# Patient Record
Sex: Female | Born: 1975 | Race: White | Hispanic: No | Marital: Married | Smoking: Never smoker
Health system: Southern US, Community
[De-identification: ages and names within clinical notes are randomized; demographics above are authoritative.]

## PROBLEM LIST (undated history)

## (undated) DIAGNOSIS — E079 Disorder of thyroid, unspecified: Secondary | ICD-10-CM

## (undated) DIAGNOSIS — K760 Fatty (change of) liver, not elsewhere classified: Secondary | ICD-10-CM

## (undated) DIAGNOSIS — K602 Anal fissure, unspecified: Secondary | ICD-10-CM

## (undated) DIAGNOSIS — I10 Essential (primary) hypertension: Secondary | ICD-10-CM

## (undated) DIAGNOSIS — E785 Hyperlipidemia, unspecified: Secondary | ICD-10-CM

## (undated) HISTORY — DX: Essential (primary) hypertension: I10

## (undated) HISTORY — DX: Fatty (change of) liver, not elsewhere classified: K76.0

## (undated) HISTORY — DX: Hyperlipidemia, unspecified: E78.5

## (undated) HISTORY — DX: Disorder of thyroid, unspecified: E07.9

## (undated) HISTORY — DX: Anal fissure, unspecified: K60.2

---

## 2000-09-11 ENCOUNTER — Other Ambulatory Visit: Admission: RE | Admit: 2000-09-11 | Discharge: 2000-09-11 | Payer: Self-pay | Admitting: Obstetrics & Gynecology

## 2001-04-10 ENCOUNTER — Inpatient Hospital Stay (HOSPITAL_COMMUNITY): Admission: AD | Admit: 2001-04-10 | Discharge: 2001-04-10 | Payer: Self-pay | Admitting: Obstetrics and Gynecology

## 2001-04-11 ENCOUNTER — Inpatient Hospital Stay (HOSPITAL_COMMUNITY): Admission: AD | Admit: 2001-04-11 | Discharge: 2001-04-13 | Payer: Self-pay | Admitting: Obstetrics and Gynecology

## 2001-04-17 ENCOUNTER — Encounter: Admission: RE | Admit: 2001-04-17 | Discharge: 2001-05-17 | Payer: Self-pay | Admitting: Obstetrics and Gynecology

## 2001-05-13 ENCOUNTER — Other Ambulatory Visit: Admission: RE | Admit: 2001-05-13 | Discharge: 2001-05-13 | Payer: Self-pay | Admitting: Obstetrics & Gynecology

## 2001-05-18 ENCOUNTER — Encounter: Admission: RE | Admit: 2001-05-18 | Discharge: 2001-06-17 | Payer: Self-pay | Admitting: Obstetrics and Gynecology

## 2002-05-21 ENCOUNTER — Other Ambulatory Visit: Admission: RE | Admit: 2002-05-21 | Discharge: 2002-05-21 | Payer: Self-pay | Admitting: Obstetrics & Gynecology

## 2003-08-20 ENCOUNTER — Other Ambulatory Visit: Admission: RE | Admit: 2003-08-20 | Discharge: 2003-08-20 | Payer: Self-pay | Admitting: Obstetrics & Gynecology

## 2004-08-24 ENCOUNTER — Other Ambulatory Visit: Admission: RE | Admit: 2004-08-24 | Discharge: 2004-08-24 | Payer: Self-pay | Admitting: Obstetrics & Gynecology

## 2004-10-08 HISTORY — PX: DILATION AND CURETTAGE OF UTERUS: SHX78

## 2004-11-16 ENCOUNTER — Ambulatory Visit (HOSPITAL_COMMUNITY): Admission: RE | Admit: 2004-11-16 | Discharge: 2004-11-16 | Payer: Self-pay | Admitting: Obstetrics & Gynecology

## 2005-09-10 ENCOUNTER — Other Ambulatory Visit: Admission: RE | Admit: 2005-09-10 | Discharge: 2005-09-10 | Payer: Self-pay | Admitting: Obstetrics & Gynecology

## 2006-01-24 ENCOUNTER — Inpatient Hospital Stay (HOSPITAL_COMMUNITY): Admission: AD | Admit: 2006-01-24 | Discharge: 2006-01-24 | Payer: Self-pay | Admitting: Obstetrics and Gynecology

## 2006-01-25 ENCOUNTER — Inpatient Hospital Stay (HOSPITAL_COMMUNITY): Admission: AD | Admit: 2006-01-25 | Discharge: 2006-02-01 | Payer: Self-pay | Admitting: Obstetrics & Gynecology

## 2006-02-02 ENCOUNTER — Encounter: Admission: RE | Admit: 2006-02-02 | Discharge: 2006-03-03 | Payer: Self-pay | Admitting: Obstetrics & Gynecology

## 2006-03-04 ENCOUNTER — Encounter: Admission: RE | Admit: 2006-03-04 | Discharge: 2006-03-13 | Payer: Self-pay | Admitting: Obstetrics & Gynecology

## 2007-05-20 IMAGING — US US FETAL BPP W/O NONSTRESS
1 series · 14 of 23 positions shown · non-contrast
Comparison: none

CLINICAL DATA: Pregnancy induced hypertension.  30 week 5 day assigned gestational age.  Evaluate BPP.

[Series 1: us fetal bpp w/o nonstress · 0.35mm/px · 14 of 23 slices shown]
[im 1/23]
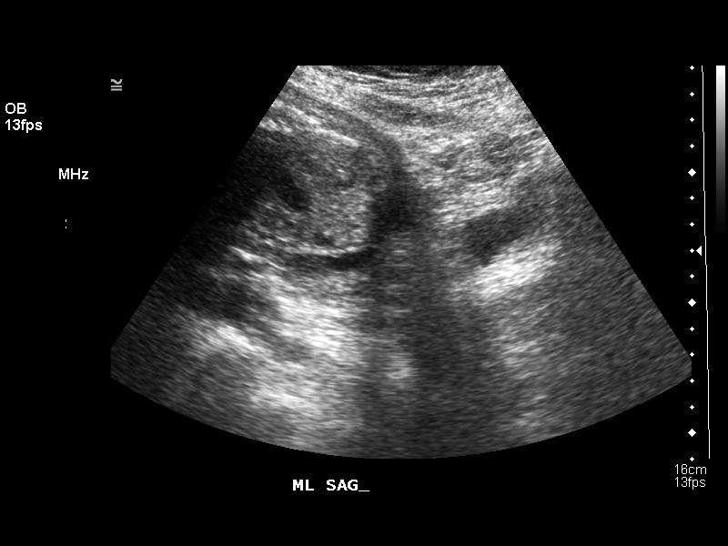
[im 3/23]
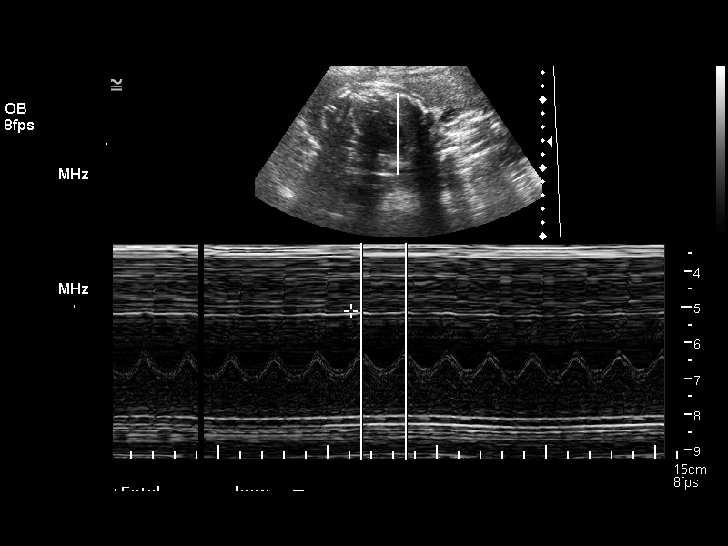
[im 5/23]
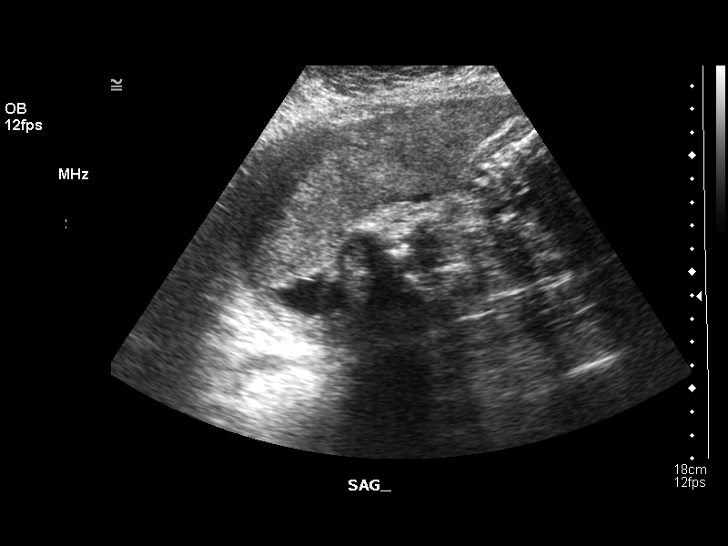
[im 6/23]
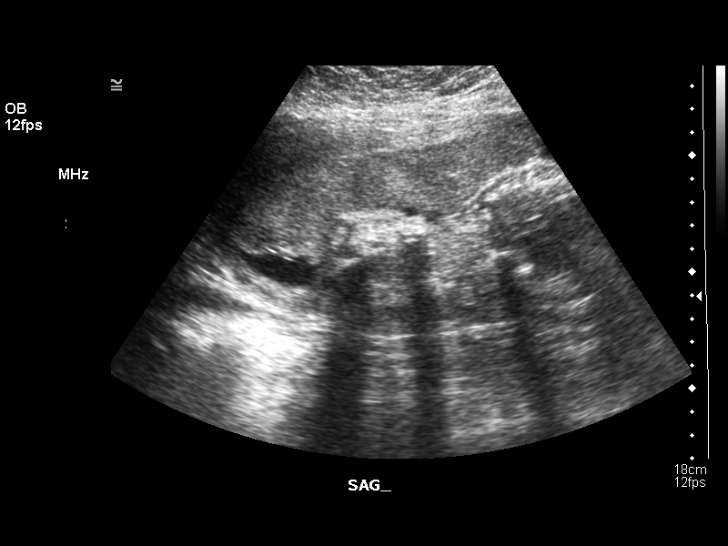
[im 8/23]
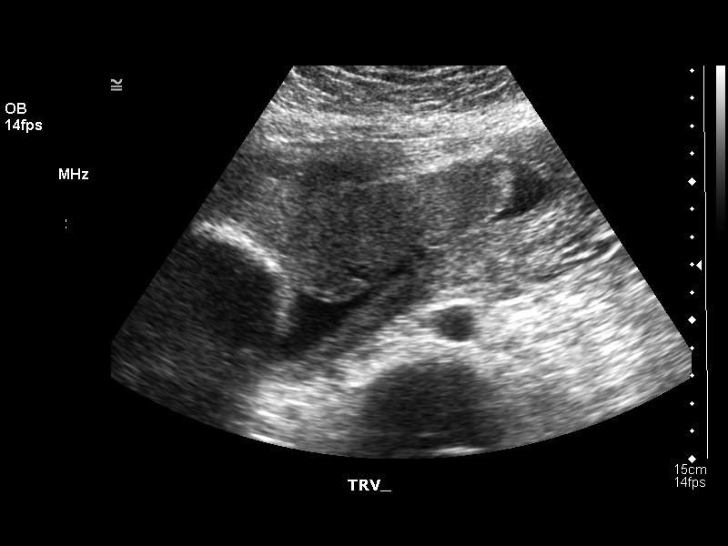
[im 10/23]
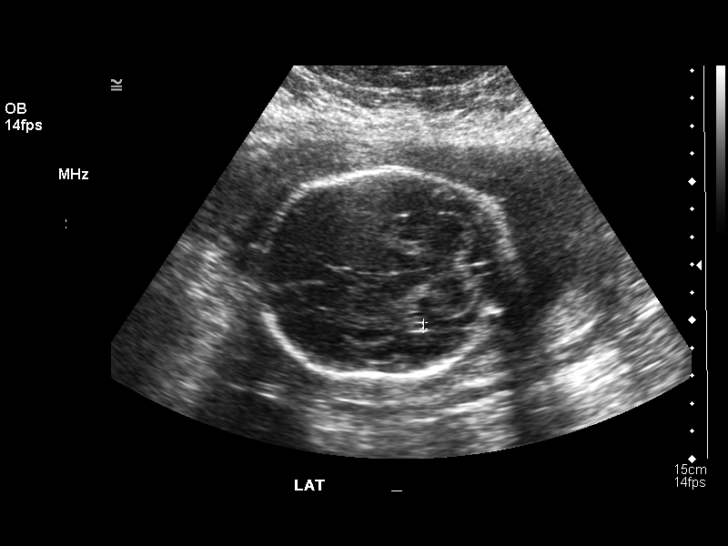
[im 11/23]
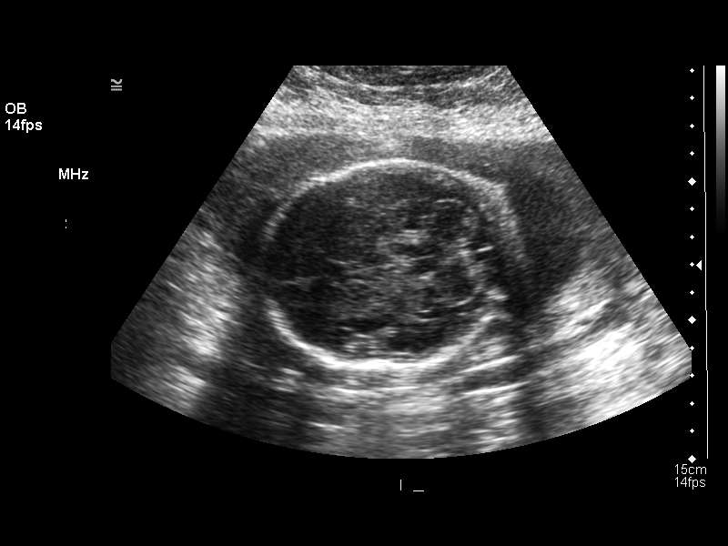
[im 13/23]
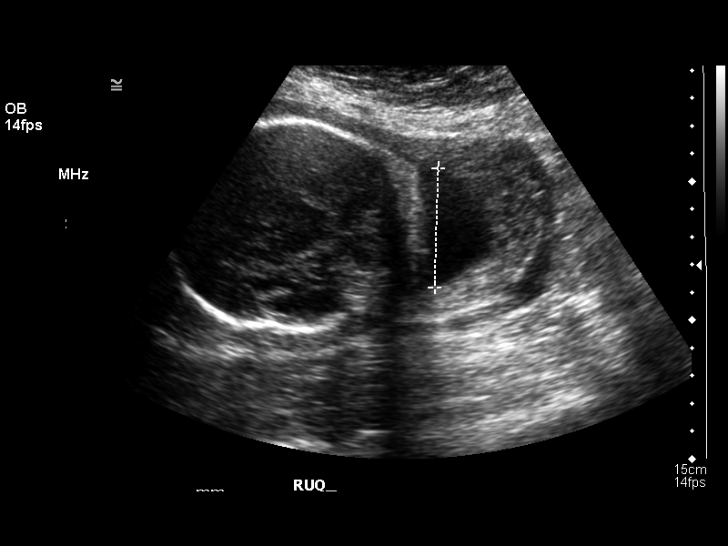
[im 14/23]
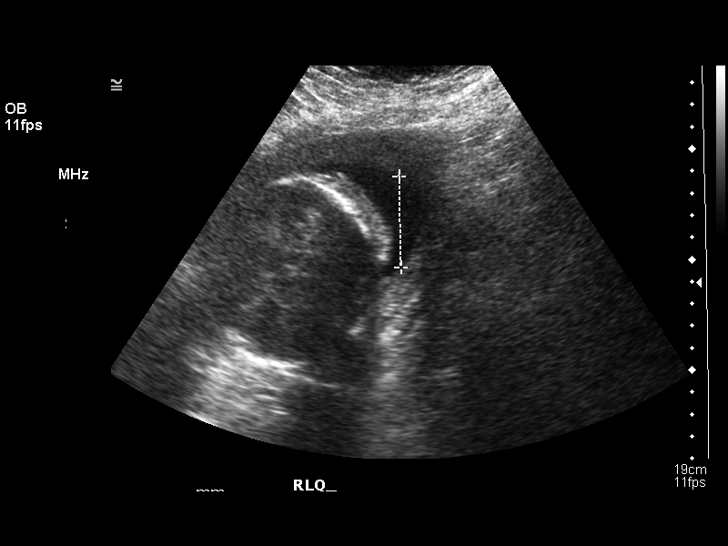
[im 16/23]
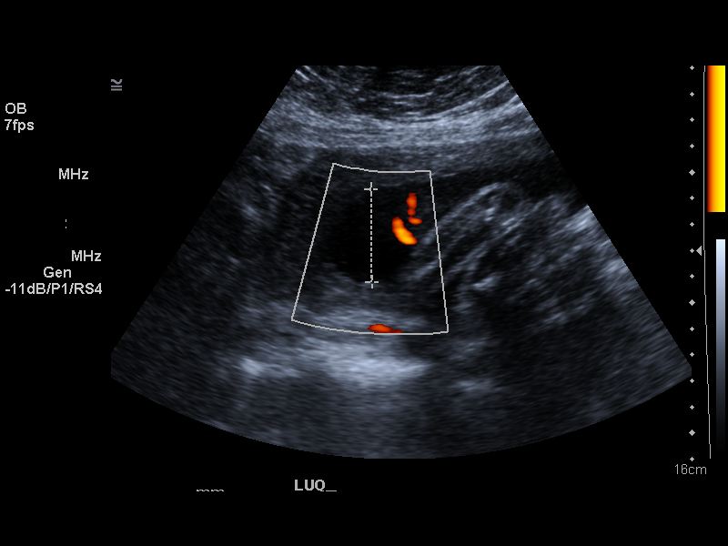
[im 18/23]
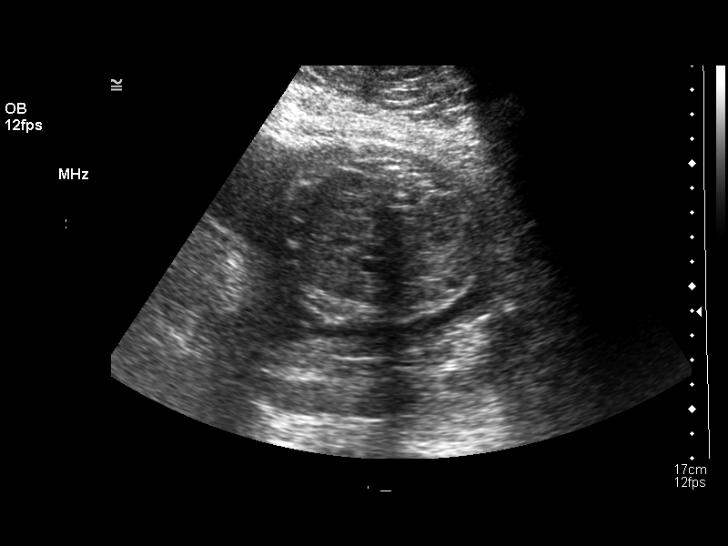
[im 19/23]
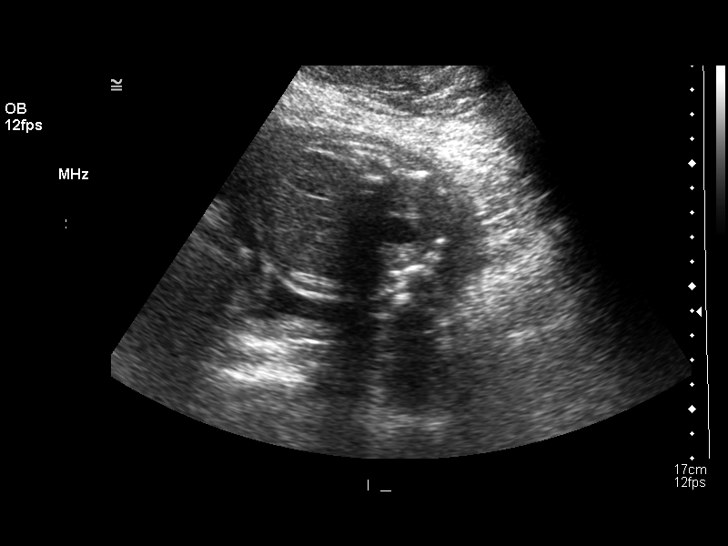
[im 21/23]
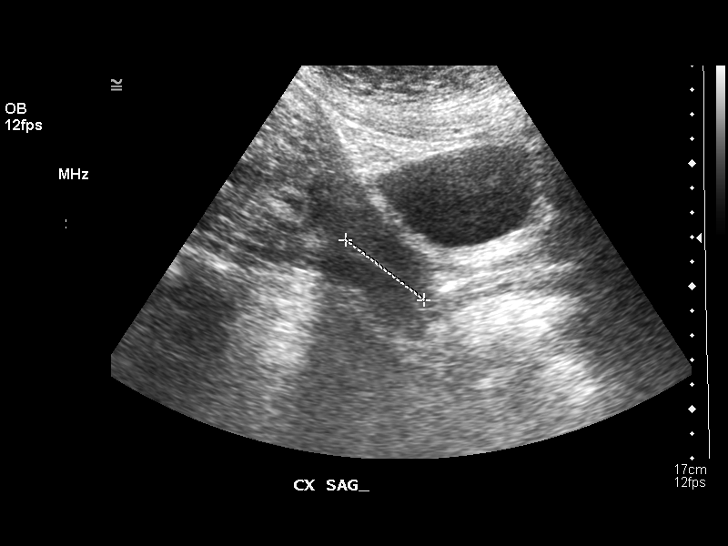
[im 23/23]
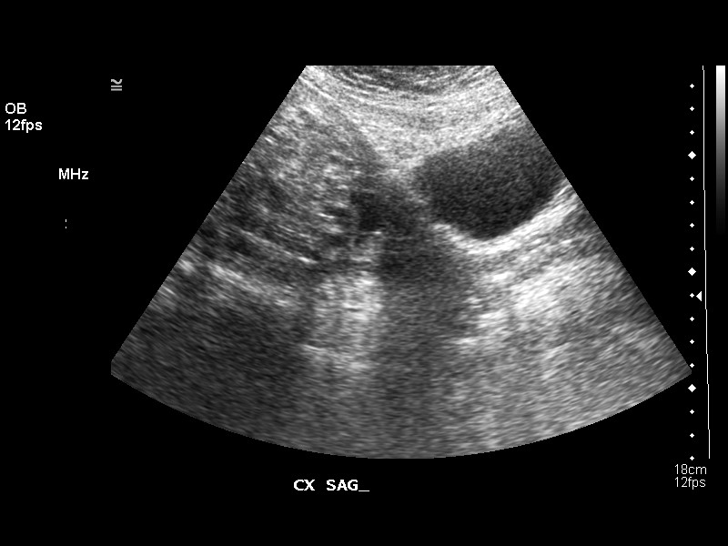

[14 of 23 positions shown; findings below may reference images not displayed]

BIOPHYSICAL PROFILE

 Number of Fetuses: 1
 Heart rate:  147
 Movement:  Yes
 Breathing:  Yes
 Presentation:  Breech
 Placental Location:  Anterior
 Grade:  I
 Previa:  No
 Amniotic Fluid (Subjective):  Normal
 Amniotic Fluid (Objective):  14.1 cm AFI (5th -95th%ile = 9.0 ? 23.4 cm for 30 wks)

 Fetal measurements and complete anatomic evaluation were not requested.  The following fetal anatomy was visualized on this exam: Lateral ventricles, thalami, stomach, kidneys, and bladder. 

 BPP SCORING
 Movements: 2    Time: 25 minutes
 Breathing:  2
 Tone:  2
 Amniotic Fluid: 2
 Total Score:  8

 MATERNAL UTERINE AND ADNEXAL FINDINGS
 Cervix: 4.4 cm Transabdominally
IMPRESSION: Single living intrauterine fetus in breech presentation.  Biophysical profile score [DATE].

## 2007-05-23 IMAGING — US US FETAL BPP W/O NONSTRESS
1 series · 13 of 28 positions shown · non-contrast
Comparison: OB ultrasound of 01/26/06.

CLINICAL DATA: Pregnancy-induced hypertension.

 LIMITED OBSTETRICAL ULTRASOUND, BPP, MCA AND UMBILICAL ARTERY DOPPLERS:

[Series 1: us fetal bpp w/o nonstress · 0.35mm/px · 13 of 33 slices shown]
[im 2/33]
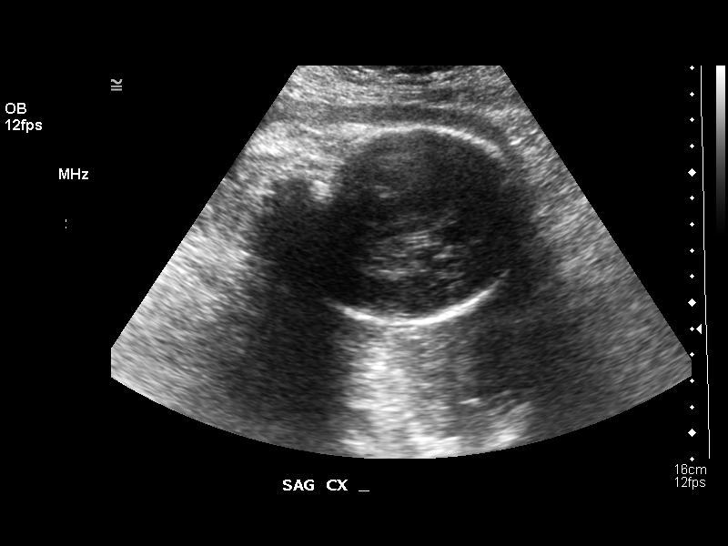
[im 4/33]
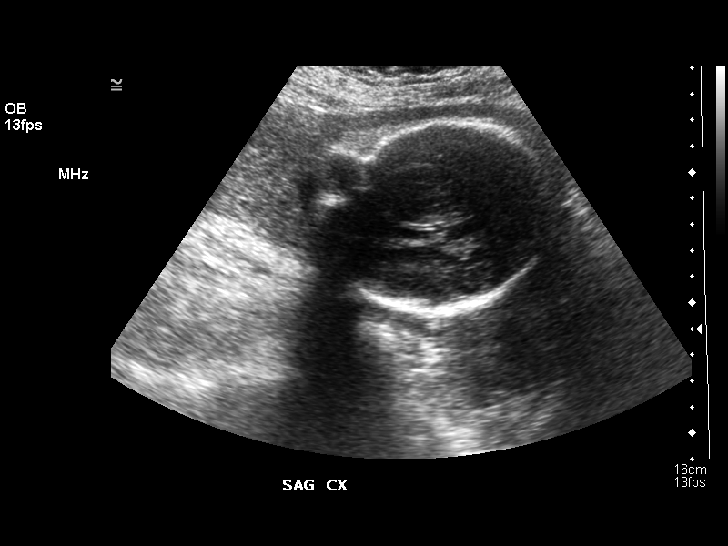
[im 6/33]
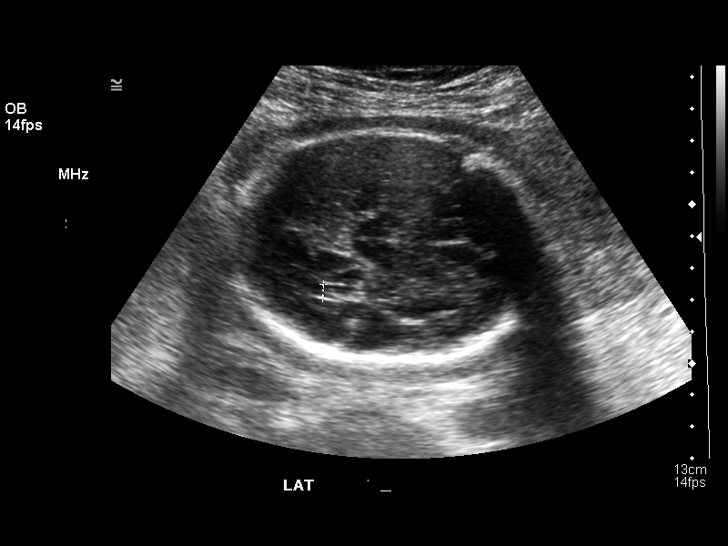
[im 9/33]
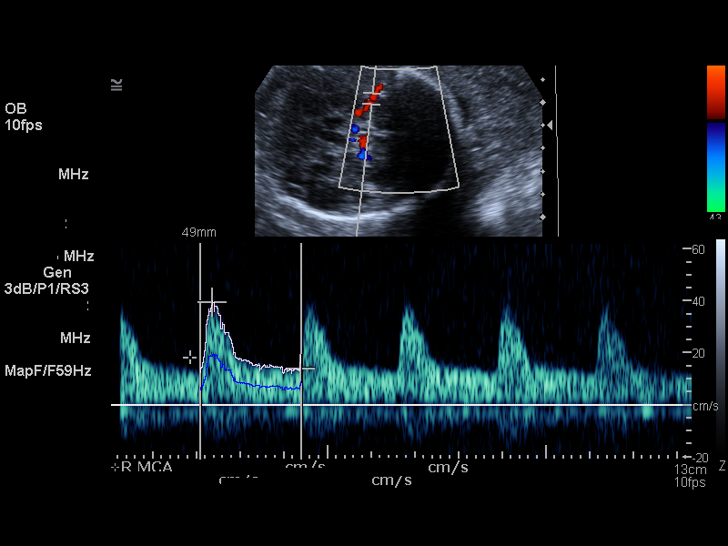
[im 11/33]
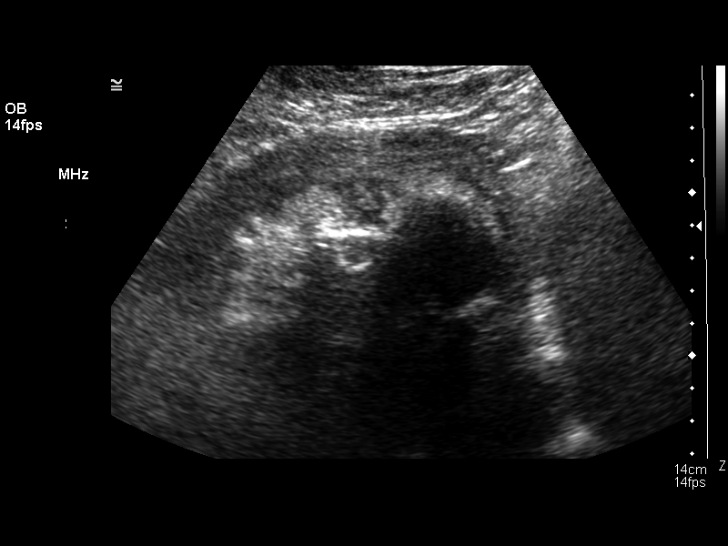
[im 14/33]
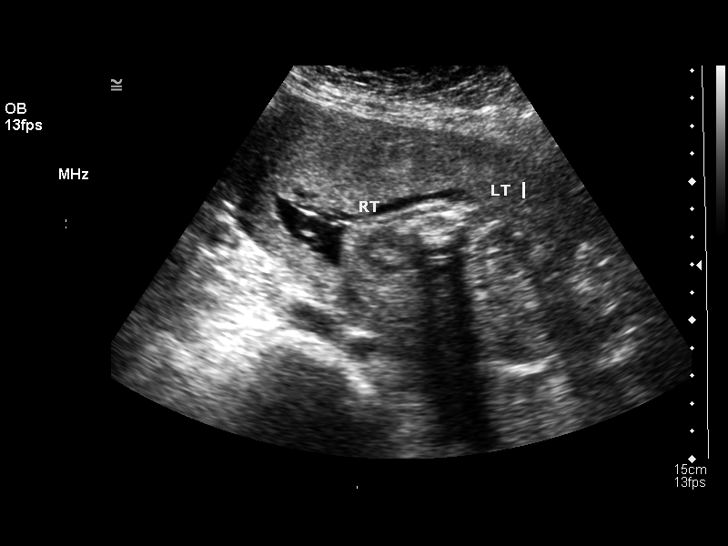
[im 17/33]
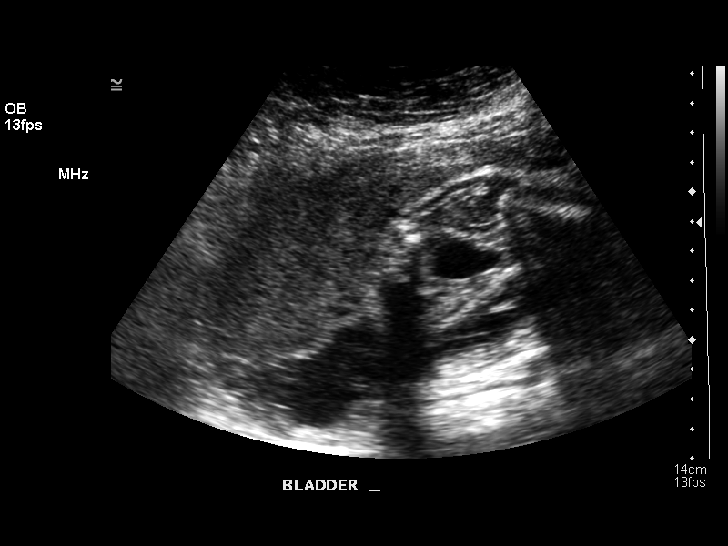
[im 19/33]
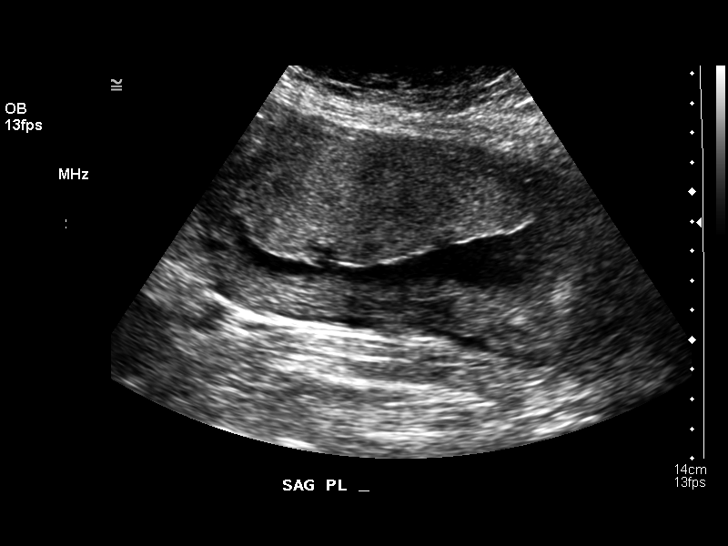
[im 22/33]
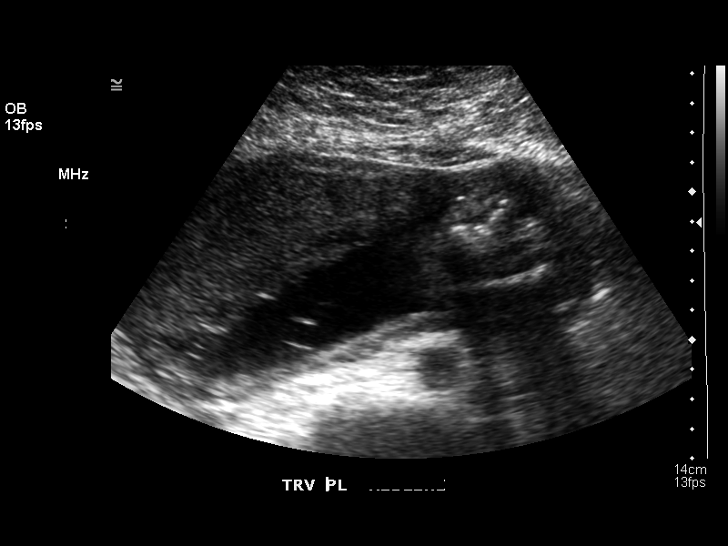
[im 24/33]
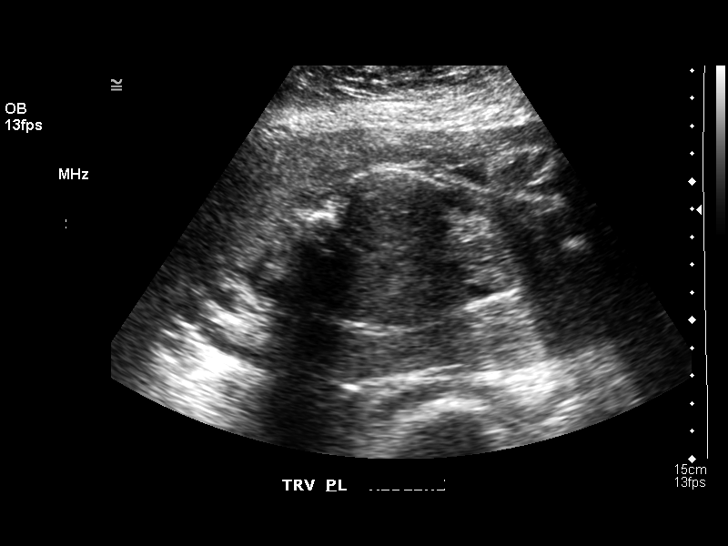
[im 27/33]
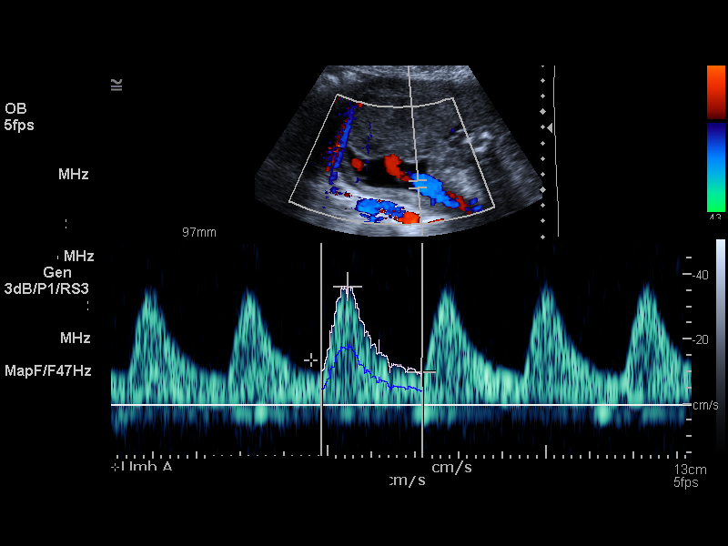
[im 29/33]
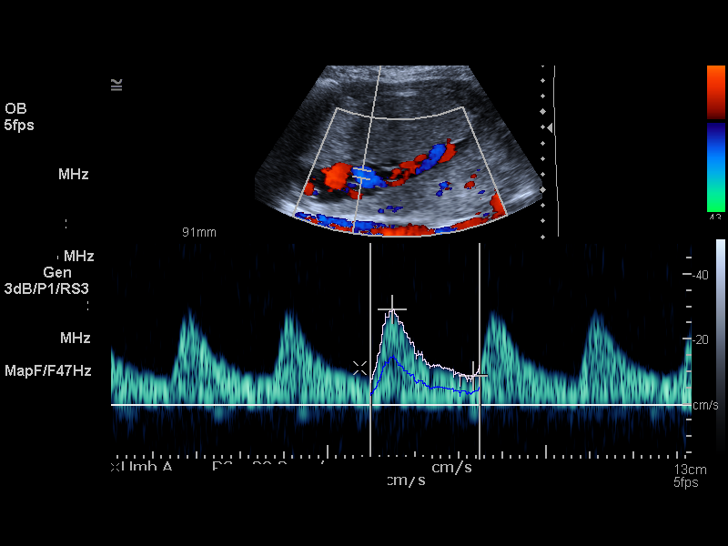
[im 31/33]
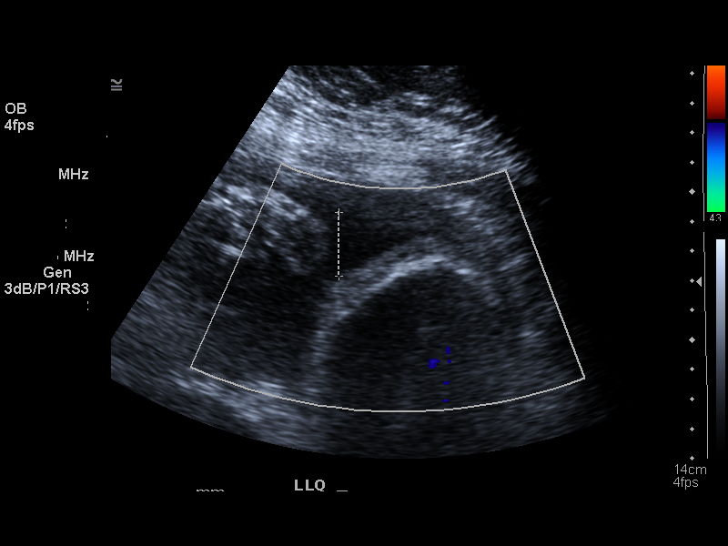

[13 of 28 positions shown; findings below may reference images not displayed]

Number of Fetuses:  1
 Heart Rate:  133 BPM
 Movement:  Yes
 Breathing:  No
 Presentation:  Cephalic
 Placental Location:  Anterior, right lateral
 Grade:  I
 Previa:  No
 Amniotic Fluid (Subjective):  Lower normal
 Amniotic Fluid (Objective):    AFI 9.9 cm (6th-36th %ile = 8.8 to 23.8 cm for 31 weeks) 

 Fetal measurements and complete anatomic evaluation were not requested.  The following fetal anatomy was visualized during this exam:  lateral ventricles, stomach, kidneys, bladder, and diaphragm.

 BIOPHYSICAL PROFILE

 Movement:    2  Time:  30 minutes
 Breathing:    0  
 Tone:    2  
 Amniotic Fluid:    2

 Total Score:  6

 DOPPLER ULTRASOUND OF FETUS:

 Umbilical Artery S/D Ratio:  3.36 (NL< 4.00)

 Middle Cerebral Artery PI:  1.4 (NL> 1.48)

 MATERNAL UTERINE AND ADNEXAL FINDINGS
 Cervix:  3.2 cm transabdominally.
IMPRESSION: 1.  Single live intrauterine gestation in cephalic presentation, biophysical profile [DATE] (breathing not observed over 30 minutes).  Cervix is long and closed.
 2.  Subjective amniotic fluid volume at the lower limits for normal, quantitatively normal.
 3.  Normal umbilical artery Doppler S/D ratio and waveform morphology.
 4.  Evaluation of the middle cerebral artery is technically suboptimal due to position, with the pulsatility index being at the lower limits of normal at 1.44 (normal greater than 1.48).  This is amenable to follow-up.

## 2009-10-08 HISTORY — PX: VENOUS ABLATION: SHX2656

## 2010-03-22 ENCOUNTER — Emergency Department: Payer: Self-pay | Admitting: Emergency Medicine

## 2012-08-21 LAB — HEPATIC FUNCTION PANEL
ALT: 20 U/L (ref 7–35)
AST: 16 U/L (ref 13–35)

## 2012-08-21 LAB — LIPID PANEL
LDL Cholesterol: 24 mg/dL
Triglycerides: 121 mg/dL (ref 40–160)

## 2013-03-26 ENCOUNTER — Encounter: Payer: Self-pay | Admitting: Family Medicine

## 2013-03-26 ENCOUNTER — Ambulatory Visit (INDEPENDENT_AMBULATORY_CARE_PROVIDER_SITE_OTHER): Payer: BC Managed Care – PPO | Admitting: Family Medicine

## 2013-03-26 VITALS — BP 118/80 | HR 80 | Temp 97.7°F | Ht 72.0 in | Wt 294.0 lb

## 2013-03-26 DIAGNOSIS — J309 Allergic rhinitis, unspecified: Secondary | ICD-10-CM

## 2013-03-26 DIAGNOSIS — R030 Elevated blood-pressure reading, without diagnosis of hypertension: Secondary | ICD-10-CM | POA: Insufficient documentation

## 2013-03-26 DIAGNOSIS — E559 Vitamin D deficiency, unspecified: Secondary | ICD-10-CM | POA: Insufficient documentation

## 2013-03-26 DIAGNOSIS — E041 Nontoxic single thyroid nodule: Secondary | ICD-10-CM | POA: Insufficient documentation

## 2013-03-26 DIAGNOSIS — O1490 Unspecified pre-eclampsia, unspecified trimester: Secondary | ICD-10-CM | POA: Insufficient documentation

## 2013-03-26 DIAGNOSIS — I8390 Asymptomatic varicose veins of unspecified lower extremity: Secondary | ICD-10-CM

## 2013-03-26 DIAGNOSIS — N3946 Mixed incontinence: Secondary | ICD-10-CM | POA: Insufficient documentation

## 2013-03-26 DIAGNOSIS — I872 Venous insufficiency (chronic) (peripheral): Secondary | ICD-10-CM | POA: Insufficient documentation

## 2013-03-26 DIAGNOSIS — K219 Gastro-esophageal reflux disease without esophagitis: Secondary | ICD-10-CM | POA: Insufficient documentation

## 2013-03-26 DIAGNOSIS — E039 Hypothyroidism, unspecified: Secondary | ICD-10-CM

## 2013-03-26 NOTE — Progress Notes (Signed)
  Subjective:    Patient ID: Bailey Andrews, female    DOB: Jun 07, 1976, 37 y.o.   MRN: [id number]  HPI  37 year old female presents to establish.  Her previous MD was Dr. Dario Guardian for PCP and thyroid management. She also has seen Dr. Jennette Kettle for GYN.. But she would like to do that at one place.. Here.  Last CPX:  07/2011.   At last labs cholesterol was borderline.  Glucose was normal few weeks ago.  She has been on HCTZ for peripheral swelling from venous insufficiency. Wear compression hose. Has had venous ablation for varicose veins. Dr. Earnestine Leys. Heaviness in legs when she does not wear compression hose.  She has several acute issues:  Urinary urgency, some stress incontinence. Issue worse when taking HCTZ. UA clear in past.  Neck pain, intermittant on right. Occ numbness  In B arms when sleeping in a particular way.  Concerned she may have a hemmorhoid. Having some rectal irritation and pressure.   Last period was irregular.. More like spotting.  husband with vasectomy.  Menses prior has been regular.  Has noted some increase in hair around chin. Nowhere else. No voice changing.        Review of Systems  Constitutional: Negative for fever and fatigue.  HENT: Negative for ear pain.   Eyes: Negative for pain.  Respiratory: Negative for chest tightness and shortness of breath.   Cardiovascular: Negative for chest pain, palpitations and leg swelling.  Gastrointestinal: Negative for abdominal pain.  Genitourinary: Negative for dysuria.  Psychiatric/Behavioral:       Under a lot of stress at work.       Objective:   Physical Exam  Constitutional: Vital signs are normal. She appears well-developed and well-nourished. She is cooperative.  Non-toxic appearance. She does not appear ill. No distress.  Obese, in NAD.  HENT:  Head: Normocephalic.  Right Ear: Hearing, tympanic membrane, external ear and ear canal normal.  Left Ear: Hearing, tympanic membrane, external ear  and ear canal normal.  Nose: Nose normal.  Eyes: Conjunctivae, EOM and lids are normal. Pupils are equal, round, and reactive to light. No foreign bodies found.  Neck: Trachea normal and normal range of motion. Neck supple. Carotid bruit is not present. No mass and no thyromegaly present.  Cardiovascular: Normal rate, regular rhythm, S1 normal, S2 normal, normal heart sounds and intact distal pulses.  Exam reveals no gallop.   No murmur heard. Pulmonary/Chest: Effort normal and breath sounds normal. No respiratory distress. She has no wheezes. She has no rhonchi. She has no rales.  Abdominal: Soft. Normal appearance and bowel sounds are normal. She exhibits no distension, no fluid wave, no abdominal bruit and no mass. There is no hepatosplenomegaly. There is no tenderness. There is no rebound, no guarding and no CVA tenderness. No hernia.  Lymphadenopathy:    She has no cervical adenopathy.    She has no axillary adenopathy.  Neurological: She is alert. She has normal strength. No cranial nerve deficit or sensory deficit.  Skin: Skin is warm, dry and intact. No rash noted.  Psychiatric: Her speech is normal and behavior is normal. Judgment normal. Her mood appears not anxious. Cognition and memory are normal. She does not exhibit a depressed mood.          Assessment & Plan:

## 2013-03-26 NOTE — Assessment & Plan Note (Signed)
Stable on HCTZ

## 2013-03-26 NOTE — Assessment & Plan Note (Signed)
Encouraged weight loss, kegel exercises. She is not currently interested in medication for urge component.

## 2013-03-26 NOTE — Patient Instructions (Addendum)
Schedule complete physcial exam in next few months. No labs prior.  Drop off records request at front desk please. It was nice meeting you today!

## 2013-03-26 NOTE — Assessment & Plan Note (Signed)
Encouraged exercise, weight loss.  Continue compression hose.

## 2013-03-26 NOTE — Assessment & Plan Note (Addendum)
Has had nml TSH few months ago, but per pt endo was checking every 3-4 months.

## 2013-04-15 ENCOUNTER — Encounter: Payer: Self-pay | Admitting: Family Medicine

## 2013-04-23 ENCOUNTER — Encounter: Payer: BC Managed Care – PPO | Admitting: Family Medicine

## 2013-05-05 ENCOUNTER — Other Ambulatory Visit (HOSPITAL_COMMUNITY)
Admission: RE | Admit: 2013-05-05 | Discharge: 2013-05-05 | Disposition: A | Discharge status: Home / Self Care | Payer: BC Managed Care – PPO | Source: Ambulatory Visit | Attending: Family Medicine | Admitting: Family Medicine

## 2013-05-05 ENCOUNTER — Encounter: Payer: Self-pay | Admitting: Family Medicine

## 2013-05-05 ENCOUNTER — Ambulatory Visit (INDEPENDENT_AMBULATORY_CARE_PROVIDER_SITE_OTHER): Payer: BC Managed Care – PPO | Admitting: Family Medicine

## 2013-05-05 ENCOUNTER — Telehealth: Payer: Self-pay | Admitting: Family Medicine

## 2013-05-05 VITALS — BP 118/80 | HR 95 | Temp 98.3°F | Ht 72.0 in | Wt 293.0 lb

## 2013-05-05 DIAGNOSIS — Z0000 Encounter for general adult medical examination without abnormal findings: Secondary | ICD-10-CM

## 2013-05-05 DIAGNOSIS — Z01419 Encounter for gynecological examination (general) (routine) without abnormal findings: Secondary | ICD-10-CM | POA: Insufficient documentation

## 2013-05-05 DIAGNOSIS — Z1151 Encounter for screening for human papillomavirus (HPV): Secondary | ICD-10-CM | POA: Insufficient documentation

## 2013-05-05 DIAGNOSIS — K219 Gastro-esophageal reflux disease without esophagitis: Secondary | ICD-10-CM

## 2013-05-05 DIAGNOSIS — E039 Hypothyroidism, unspecified: Secondary | ICD-10-CM

## 2013-05-05 DIAGNOSIS — R030 Elevated blood-pressure reading, without diagnosis of hypertension: Secondary | ICD-10-CM

## 2013-05-05 DIAGNOSIS — G44209 Tension-type headache, unspecified, not intractable: Secondary | ICD-10-CM

## 2013-05-05 NOTE — Patient Instructions (Addendum)
Avoid reflux triggers. Take prilosec 20 mg daily for 4-6 weeks then taper off. Working on weight loss, exercise and healthy eating. Can try Alli for weight loss. Look into [employer].

## 2013-05-05 NOTE — Addendum Note (Signed)
Addended by: Criselda Peaches B on: 05/05/2013 12:04 PM   Modules accepted: Orders

## 2013-05-05 NOTE — Assessment & Plan Note (Signed)
Nml neuro exam.  Work on healthy lifestyle and stress reduction.

## 2013-05-05 NOTE — Assessment & Plan Note (Signed)
Well controlled 

## 2013-05-05 NOTE — Assessment & Plan Note (Signed)
Trigger avoidance. Prilosec 4-6 weeks then taper off, may use 2 tabs daily if needed.

## 2013-05-05 NOTE — Progress Notes (Signed)
Subjective:    Patient ID: Bailey Andrews, female    DOB: 06-16-1976, 37 y.o.   MRN: [id number]  HPI  The patient is here for annual wellness exam and preventative care.    Last check labs 12/2012  Per Dr. Dario Guardian TSH stable at 0.903 Chol LDL 134, HDL48, trig 116  BP well controlled today.  At last OV she was concerned she may have a hemmorhoid. Was having some rectal irritation and pressure, *resolved now.   At last OV: Last period was irregular.. More like spotting.  husband with vasectomy.  Menses prior has been regular.  Has noted some increase in hair around chin. Nowhere else.  No voice changing.  *Since then her menses has been improved.   She has been having some right sided headache at times. No nausea, does have photo and phonophobia. No vision changes, no numbness, no weakness.  She is under a lot of stress at work and home. No issues with headache when not at work.  Minimal exercise. Poor diet. Eats out a lot. Wt Readings from Last 3 Encounters:  05/05/13 293 lb (132.904 kg)  03/26/13 294 lb (133.358 kg)    GERD: moderate control. Using prilosec 20 mg daily in last few days... It has helped significantly.     Review of Systems  Constitutional: Negative for fever and fatigue.  HENT: Negative for ear pain.   Eyes: Negative for pain.  Respiratory: Negative for chest tightness and shortness of breath.   Cardiovascular: Negative for chest pain, palpitations and leg swelling.  Gastrointestinal: Negative for nausea, abdominal pain, diarrhea, constipation and anal bleeding.  Genitourinary: Negative for dysuria.  Neurological: Positive for headaches. Negative for dizziness, seizures, syncope, speech difficulty, weakness and light-headedness.  Psychiatric/Behavioral: Negative for dysphoric mood.       Objective:   Physical Exam  Constitutional: She is oriented to person, place, and time. Vital signs are normal. She appears well-developed and well-nourished.  She is cooperative.  Non-toxic appearance. She does not appear ill. No distress.  HENT:  Head: Normocephalic.  Right Ear: Hearing, tympanic membrane, external ear and ear canal normal.  Left Ear: Hearing, tympanic membrane, external ear and ear canal normal.  Nose: Nose normal.  Eyes: Conjunctivae, EOM and lids are normal. Pupils are equal, round, and reactive to light. No foreign bodies found.  Neck: Trachea normal and normal range of motion. Neck supple. Carotid bruit is not present. No mass and no thyromegaly present.  Cardiovascular: Normal rate, regular rhythm, S1 normal, S2 normal, normal heart sounds and intact distal pulses.  Exam reveals no gallop.   No murmur heard. Pulmonary/Chest: Effort normal and breath sounds normal. No respiratory distress. She has no wheezes. She has no rhonchi. She has no rales.  Abdominal: Soft. Normal appearance and bowel sounds are normal. She exhibits no distension, no fluid wave, no abdominal bruit and no mass. There is no hepatosplenomegaly. There is no tenderness. There is no rebound, no guarding and no CVA tenderness. No hernia.  Genitourinary: Vagina normal and uterus normal. No breast swelling, tenderness, discharge or bleeding. Pelvic exam was performed with patient prone. There is no rash, tenderness or lesion on the right labia. There is no rash, tenderness or lesion on the left labia. Uterus is not enlarged and not tender. Cervix exhibits no motion tenderness, no discharge and no friability. Right adnexum displays no mass, no tenderness and no fullness. Left adnexum displays no mass, no tenderness and no fullness.  Lymphadenopathy:  She has no cervical adenopathy.    She has no axillary adenopathy.  Neurological: She is alert and oriented to person, place, and time. She has normal strength and normal reflexes. No cranial nerve deficit or sensory deficit. Coordination and gait normal.  Skin: Skin is warm, dry and intact. No rash noted.  Psychiatric:  Her speech is normal and behavior is normal. Judgment normal. Her mood appears not anxious. Cognition and memory are normal. She does not exhibit a depressed mood.          Assessment & Plan:  The patient's preventative maintenance and recommended screening tests for an annual wellness exam were reviewed in full today. Brought up to date unless services declined.  Counselled on the importance of diet, exercise, and its role in overall health and mortality. The patient's FH and SH was reviewed, including their home life, tobacco status, and drug and alcohol status.   Vaccines: Tdap to be given today Mammo: start age 69. PAP/DVE: Due today

## 2013-05-06 NOTE — Telephone Encounter (Signed)
Error

## 2013-05-11 ENCOUNTER — Encounter: Payer: Self-pay | Admitting: *Deleted

## 2013-07-20 ENCOUNTER — Other Ambulatory Visit: Payer: Self-pay

## 2013-07-20 NOTE — Telephone Encounter (Signed)
Pt request refill HCTZ and Levothyroxine to [facility] Cancel; pt not sure when had last thyroid lab testing done by Dr.Jadali.Please advise.

## 2013-07-21 MED ORDER — LEVOTHYROXINE SODIUM 150 MCG PO TABS: 150.0000 ug | ORAL_TABLET | Freq: Every day | ORAL | Status: DC

## 2013-07-21 MED ORDER — HYDROCHLOROTHIAZIDE 25 MG PO TABS: 25.0000 mg | ORAL_TABLET | Freq: Every day | ORAL | Status: DC

## 2013-07-21 NOTE — Telephone Encounter (Signed)
Will refil but have pt call Dr. Dario Guardian to get last TSH.. If not checked in last year we can order here.

## 2013-07-22 NOTE — Telephone Encounter (Signed)
Last TSH with Dr. Dario Guardian 12/2012- 0.903-They will fax over copy of labs.  Briget notified prescriptions has been sent to her pharmacy.  Also informed her to make sure she is getting her TSH checked once a year either with Dr. Dario Guardian or here at our clinic.

## 2013-12-01 ENCOUNTER — Ambulatory Visit (INDEPENDENT_AMBULATORY_CARE_PROVIDER_SITE_OTHER): Payer: BC Managed Care – PPO | Admitting: Family Medicine

## 2013-12-01 ENCOUNTER — Encounter: Payer: Self-pay | Admitting: Family Medicine

## 2013-12-01 VITALS — BP 116/84 | HR 72 | Temp 98.3°F | Wt 293.8 lb

## 2013-12-01 DIAGNOSIS — J209 Acute bronchitis, unspecified: Secondary | ICD-10-CM

## 2013-12-01 MED ORDER — AZITHROMYCIN 250 MG PO TABS: ORAL_TABLET | ORAL | Status: DC

## 2013-12-01 MED ORDER — GUAIFENESIN-CODEINE 100-10 MG/5ML PO SYRP: 5.0000 mL | ORAL_SOLUTION | Freq: Two times a day (BID) | ORAL | Status: DC | PRN

## 2013-12-01 NOTE — Progress Notes (Signed)
Pre visit review using our clinic review tool, if applicable. No additional management support is needed unless otherwise documented below in the visit note. 

## 2013-12-01 NOTE — Assessment & Plan Note (Signed)
Anticipate viral process - discussed this with patient. Treat supportively with ibuprofen, mucinex, and cheratussin cough syrup for night time. WASP script for zpack provided with indications to fill abx

## 2013-12-01 NOTE — Progress Notes (Signed)
BP 116/84  Pulse 72  Temp(Src) 98.3 F (36.8 C) (Oral)  Wt 293 lb 12 oz (133.244 kg)  LMP 11/28/2013   CC: cough  Subjective:    Patient ID: Bailey Andrews, female    DOB: 02/06/1976, 38 y.o.   MRN: [id number]  HPI: Bailey Andrews is a 38 y.o. female presenting on 12/01/2013 with URI   2 mo ago treated for sinus infection with biaxin and sxs improved. Then a few weeks ago again felt sick, this improved on its own.  Now with 3-4d h/o sinus pressure with congestion in chest with cough.  + pressure headache around right eye from cough.  Headache worse at end of day.  +PNdrainage.  Chest > head congestion.  No fevers/chills, abd pain, ear or tooth pain , dyspnea or wheezing.  No coughing fits.  Taking motrin and ,mucinex 600mg  which have helped.  No sick contacts at home.  + sick at work No smokers at home. No h/o asthma or COPD.  Did have childhood asthma. Did not receive flu shot this year.  Relevant past medical, surgical, family and social history reviewed and updated as indicated.  Allergies and medications reviewed and updated. Current Outpatient Prescriptions on File Prior to Visit  Medication Sig  . hydrochlorothiazide (HYDRODIURIL) 25 MG tablet Take 1 tablet (25 mg total) by mouth daily.  Marland Kitchen. levothyroxine (SYNTHROID, LEVOTHROID) 150 MCG tablet Take 1 tablet (150 mcg total) by mouth daily.  Marland Kitchen. omeprazole (PRILOSEC) 40 MG capsule Take 40 mg by mouth daily.   No current facility-administered medications on file prior to visit.    Review of Systems Per HPI unless specifically indicated above    Objective:    BP 116/84  Pulse 72  Temp(Src) 98.3 F (36.8 C) (Oral)  Wt 293 lb 12 oz (133.244 kg)  LMP 11/28/2013  Physical Exam  Nursing note and vitals reviewed. Constitutional: She appears well-developed and well-nourished. No distress.  HENT:  Head: Normocephalic and atraumatic.  Right Ear: Hearing, tympanic membrane, external ear and ear canal normal.  Left  Ear: Hearing, tympanic membrane, external ear and ear canal normal.  Nose: No mucosal edema or rhinorrhea. Right sinus exhibits maxillary sinus tenderness. Right sinus exhibits no frontal sinus tenderness. Left sinus exhibits maxillary sinus tenderness. Left sinus exhibits no frontal sinus tenderness.  Mouth/Throat: Uvula is midline, oropharynx is clear and moist and mucous membranes are normal. No oropharyngeal exudate, posterior oropharyngeal edema, posterior oropharyngeal erythema or tonsillar abscesses.  Some nasal congestion  Eyes: Conjunctivae and EOM are normal. Pupils are equal, round, and reactive to light. No scleral icterus.  Neck: Normal range of motion. Neck supple.  Cardiovascular: Normal rate, regular rhythm, normal heart sounds and intact distal pulses.   No murmur heard. Pulmonary/Chest: Effort normal and breath sounds normal. No respiratory distress. She has no decreased breath sounds. She has no wheezes. She has no rhonchi. She has no rales.  Harsh cough present, some tight air movement  Lymphadenopathy:    She has no cervical adenopathy.  Skin: Skin is warm and dry. No rash noted.       Assessment & Plan:   Problem List Items Addressed This Visit   Acute bronchitis - Primary     Anticipate viral process - discussed this with patient. Treat supportively with ibuprofen, mucinex, and cheratussin cough syrup for night time. WASP script for zpack provided with indications to fill abx        Follow up plan: Return if symptoms  worsen or fail to improve.

## 2013-12-01 NOTE — Patient Instructions (Signed)
I think you have a bronchitis. Take medicine as prescribed: codeine cough syrup for night time. If not improving or any worsening, fill zpack. Push fluids and plenty of rest. Nasal saline irrigation or neti pot to help drain sinuses. May continue simple mucinex with plenty of fluid to help mobilize mucous, continue ibuprofen. Let us know if fever >101.5, trouble opening/closing mouth, difficulty swallowing, or worsening - you may need to be seen again.

## 2014-02-05 ENCOUNTER — Other Ambulatory Visit: Payer: Self-pay | Admitting: Family Medicine

## 2014-03-23 ENCOUNTER — Other Ambulatory Visit: Payer: Self-pay | Admitting: Family Medicine

## 2014-04-23 ENCOUNTER — Encounter: Payer: Self-pay | Admitting: Family Medicine

## 2014-04-23 ENCOUNTER — Ambulatory Visit (INDEPENDENT_AMBULATORY_CARE_PROVIDER_SITE_OTHER): Payer: BC Managed Care – PPO | Admitting: Family Medicine

## 2014-04-23 VITALS — BP 124/80 | HR 95 | Temp 99.1°F | Ht 71.58 in | Wt 297.0 lb

## 2014-04-23 DIAGNOSIS — R030 Elevated blood-pressure reading, without diagnosis of hypertension: Secondary | ICD-10-CM

## 2014-04-23 DIAGNOSIS — I872 Venous insufficiency (chronic) (peripheral): Secondary | ICD-10-CM | POA: Insufficient documentation

## 2014-04-23 DIAGNOSIS — E038 Other specified hypothyroidism: Secondary | ICD-10-CM

## 2014-04-23 LAB — TSH: TSH: 0.57 u[IU]/mL (ref 0.35–4.50)

## 2014-04-23 MED ORDER — LEVOTHYROXINE SODIUM 150 MCG PO TABS: ORAL_TABLET | ORAL | Status: DC

## 2014-04-23 NOTE — Patient Instructions (Signed)
Cut back on sweet tea.  Work on low carb , lower portion size diet and increase exercise.  Stopa tlab on way out.

## 2014-04-23 NOTE — Assessment & Plan Note (Signed)
Due for re-eval. 

## 2014-04-23 NOTE — Addendum Note (Signed)
Addended by: Damita LackLORING, Jamine Wingate S on: 04/23/2014 05:34 PM   Modules accepted: Orders

## 2014-04-23 NOTE — Progress Notes (Signed)
   Subjective:    Patient ID: Bailey Andrews, female    DOB: 01/26/1976, 38 y.o.   MRN: [id number]  HPI  38 year old female with history of hypothyroid presents for recheck thyroid. She is overdue for a CP and is scheduled for this with labs prior in 06/2014.  Hypothyroid  She reports good control. No fatigue, occ swelling, no dry skin, no colfd intolerance.  Hypertension:  Well controlled on HCTZ Using medication without problems or lightheadedness: None Chest pain with exertion:None Edema: some Short of breath:None Average home BPs: 120/70s Other issues:  BP Readings from Last 3 Encounters:  04/23/14 124/80  12/01/13 116/84  05/05/13 118/80   Weight issues. Minimal exercise, did moderate control off and on. Wt Readings from Last 3 Encounters:  04/23/14 297 lb (134.718 kg)  12/01/13 293 lb 12 oz (133.244 kg)  05/05/13 293 lb (132.904 kg)     Review of Systems  Constitutional: Negative for fever and fatigue.  HENT: Negative for ear pain.   Eyes: Negative for pain.  Respiratory: Negative for chest tightness and shortness of breath.   Cardiovascular: Negative for chest pain, palpitations and leg swelling.  Gastrointestinal: Negative for abdominal pain.  Genitourinary: Negative for dysuria.       Objective:   Physical Exam  Constitutional: Vital signs are normal. She appears well-developed and well-nourished. She is cooperative.  Non-toxic appearance. She does not appear ill. No distress.  HENT:  Head: Normocephalic.  Right Ear: Hearing, tympanic membrane, external ear and ear canal normal. Tympanic membrane is not erythematous, not retracted and not bulging.  Left Ear: Hearing, tympanic membrane, external ear and ear canal normal. Tympanic membrane is not erythematous, not retracted and not bulging.  Nose: No mucosal edema or rhinorrhea. Right sinus exhibits no maxillary sinus tenderness and no frontal sinus tenderness. Left sinus exhibits no maxillary sinus tenderness  and no frontal sinus tenderness.  Mouth/Throat: Uvula is midline, oropharynx is clear and moist and mucous membranes are normal.  Eyes: Conjunctivae, EOM and lids are normal. Pupils are equal, round, and reactive to light. Lids are everted and swept, no foreign bodies found.  Neck: Trachea normal and normal range of motion. Neck supple. Carotid bruit is not present. No mass and no thyromegaly present.  Cardiovascular: Normal rate, regular rhythm, S1 normal, S2 normal, normal heart sounds, intact distal pulses and normal pulses.  Exam reveals no gallop and no friction rub.   No murmur heard. Pulmonary/Chest: Effort normal and breath sounds normal. Not tachypneic. No respiratory distress. She has no decreased breath sounds. She has no wheezes. She has no rhonchi. She has no rales.  Abdominal: Soft. Normal appearance and bowel sounds are normal. There is no tenderness.  Neurological: She is alert.  Skin: Skin is warm, dry and intact. No rash noted.  Psychiatric: Her speech is normal and behavior is normal. Judgment and thought content normal. Her mood appears not anxious. Cognition and memory are normal. She does not exhibit a depressed mood.          Assessment & Plan:

## 2014-04-23 NOTE — Progress Notes (Signed)
Pre visit review using our clinic review tool, if applicable. No additional management support is needed unless otherwise documented below in the visit note. 

## 2014-04-23 NOTE — Assessment & Plan Note (Signed)
Well controlled on HCTZ daily.

## 2014-05-02 ENCOUNTER — Other Ambulatory Visit: Payer: Self-pay | Admitting: Family Medicine

## 2014-05-04 ENCOUNTER — Other Ambulatory Visit: Payer: Self-pay | Admitting: Family Medicine

## 2014-06-10 ENCOUNTER — Telehealth: Payer: Self-pay | Admitting: Family Medicine

## 2014-06-10 DIAGNOSIS — E041 Nontoxic single thyroid nodule: Secondary | ICD-10-CM

## 2014-06-10 DIAGNOSIS — R030 Elevated blood-pressure reading, without diagnosis of hypertension: Secondary | ICD-10-CM

## 2014-06-10 DIAGNOSIS — E039 Hypothyroidism, unspecified: Secondary | ICD-10-CM

## 2014-06-10 DIAGNOSIS — E559 Vitamin D deficiency, unspecified: Secondary | ICD-10-CM

## 2014-06-10 NOTE — Telephone Encounter (Signed)
Message copied by Excell Seltzer on Thu Jun 10, 2014 11:52 PM ------      Message from: Alvina Chou      Created: Fri Jun 04, 2014  2:55 PM      Regarding: Lab orders for Friday, 9.4.15       Patient is scheduled for CPX labs, please order future labs, Thanks , Terri       ------

## 2014-06-11 ENCOUNTER — Other Ambulatory Visit (INDEPENDENT_AMBULATORY_CARE_PROVIDER_SITE_OTHER): Payer: BC Managed Care – PPO

## 2014-06-11 DIAGNOSIS — E039 Hypothyroidism, unspecified: Secondary | ICD-10-CM

## 2014-06-11 DIAGNOSIS — E559 Vitamin D deficiency, unspecified: Secondary | ICD-10-CM

## 2014-06-11 DIAGNOSIS — R030 Elevated blood-pressure reading, without diagnosis of hypertension: Secondary | ICD-10-CM

## 2014-06-11 LAB — COMPREHENSIVE METABOLIC PANEL
ALT: 22 U/L (ref 0–35)
AST: 21 U/L (ref 0–37)
Albumin: 3.8 g/dL (ref 3.5–5.2)
Alkaline Phosphatase: 61 U/L (ref 39–117)
BUN: 10 mg/dL (ref 6–23)
CALCIUM: 8.8 mg/dL (ref 8.4–10.5)
CO2: 31 meq/L (ref 19–32)
Chloride: 102 mEq/L (ref 96–112)
Creatinine, Ser: 0.9 mg/dL (ref 0.4–1.2)
GFR: 76.68 mL/min (ref >60.00)
Glucose, Bld: 94 mg/dL (ref 70–99)
Potassium: 3.7 mEq/L (ref 3.5–5.1)
Sodium: 138 mEq/L (ref 135–145)
TOTAL PROTEIN: 7.1 g/dL (ref 6.0–8.3)
Total Bilirubin: 0.6 mg/dL (ref 0.2–1.2)

## 2014-06-11 LAB — LIPID PANEL
CHOL/HDL RATIO: 5
Cholesterol: 196 mg/dL (ref 0–200)
HDL: 39.1 mg/dL (ref >39.00)
LDL Cholesterol: 131 mg/dL — ABNORMAL HIGH (ref 0–99)
NONHDL: 156.9
Triglycerides: 132 mg/dL (ref 0.0–149.0)
VLDL: 26.4 mg/dL (ref 0.0–40.0)

## 2014-06-11 LAB — VITAMIN D 25 HYDROXY (VIT D DEFICIENCY, FRACTURES): VITD: 25.03 ng/mL — AB (ref 30.00–100.00)

## 2014-06-11 LAB — TSH: TSH: 1.24 u[IU]/mL (ref 0.35–4.50)

## 2014-06-18 ENCOUNTER — Ambulatory Visit (INDEPENDENT_AMBULATORY_CARE_PROVIDER_SITE_OTHER): Payer: BC Managed Care – PPO | Admitting: Family Medicine

## 2014-06-18 ENCOUNTER — Encounter: Payer: Self-pay | Admitting: Family Medicine

## 2014-06-18 VITALS — BP 120/80 | HR 93 | Temp 98.5°F | Ht 71.5 in | Wt 292.5 lb

## 2014-06-18 DIAGNOSIS — Z23 Encounter for immunization: Secondary | ICD-10-CM

## 2014-06-18 DIAGNOSIS — R030 Elevated blood-pressure reading, without diagnosis of hypertension: Secondary | ICD-10-CM

## 2014-06-18 DIAGNOSIS — E559 Vitamin D deficiency, unspecified: Secondary | ICD-10-CM

## 2014-06-18 DIAGNOSIS — N3946 Mixed incontinence: Secondary | ICD-10-CM

## 2014-06-18 DIAGNOSIS — E039 Hypothyroidism, unspecified: Secondary | ICD-10-CM

## 2014-06-18 DIAGNOSIS — Z0000 Encounter for general adult medical examination without abnormal findings: Secondary | ICD-10-CM

## 2014-06-18 DIAGNOSIS — R3915 Urgency of urination: Secondary | ICD-10-CM

## 2014-06-18 DIAGNOSIS — K219 Gastro-esophageal reflux disease without esophagitis: Secondary | ICD-10-CM

## 2014-06-18 DIAGNOSIS — J3089 Other allergic rhinitis: Secondary | ICD-10-CM

## 2014-06-18 LAB — POCT URINALYSIS DIPSTICK
BILIRUBIN UA: NEGATIVE
Glucose, UA: NEGATIVE
Ketones, UA: NEGATIVE
LEUKOCYTES UA: NEGATIVE
NITRITE UA: NEGATIVE
Protein, UA: NEGATIVE
RBC UA: NEGATIVE
Spec Grav, UA: 1.015
Urobilinogen, UA: 0.2
pH, UA: 6

## 2014-06-18 MED ORDER — VITAMIN D (ERGOCALCIFEROL) 1.25 MG (50000 UNIT) PO CAPS: 50000.0000 [IU] | ORAL_CAPSULE | ORAL | Status: DC

## 2014-06-18 NOTE — Assessment & Plan Note (Signed)
Restart vit D daily.

## 2014-06-18 NOTE — Assessment & Plan Note (Signed)
Well controlled on HCTZ 

## 2014-06-18 NOTE — Addendum Note (Signed)
Addended by: Damita Lack on: 06/18/2014 03:18 PM   Modules accepted: Orders

## 2014-06-18 NOTE — Assessment & Plan Note (Signed)
Start zyrtec OTC at bedtime.

## 2014-06-18 NOTE — Progress Notes (Signed)
Pre visit review using our clinic review tool, if applicable. No additional management support is needed unless otherwise documented below in the visit note. 

## 2014-06-18 NOTE — Assessment & Plan Note (Signed)
Likely causing clearing of throat. Increase prilosec to 40 mg.

## 2014-06-18 NOTE — Progress Notes (Signed)
The patient is here for annual wellness exam and preventative care.   She reports she is doing well overall. She has had some allergies, post nasal drip, sneezing. Occ using mucinex.  She has to clear her throat a lot ongoing intermittent in last year.  She uses prilosec daily for GERD. She has reflux after triggers.   She has been having urinary frequency, urgency  intermittent, ongoing x several months.  Occ incontinence if wakes up at night having to go to sleep.No dysuria, no odor in urine. No fever. No abd pain. occ incontinence with coughing fits. She wonders if it is from the diuretic. She is S/P NSVD.  Reviewed labs in detail. No DM. Hypercholesterolemia, new: strong family histroy of CAD early. Lab Results  Component Value Date   CHOL 196 06/11/2014   HDL 39.10 06/11/2014   LDLCALC 131* 06/11/2014   TRIG 132.0 06/11/2014   CHOLHDL 5 06/11/2014   Vit D low. Hypothyroidism, well controlled:  Lab Results  Component Value Date   TSH 1.24 06/11/2014   Exercise: walking 2 times a week.   She has been working on low carb diet.  Wt Readings from Last 3 Encounters:  04/23/14 297 lb (134.718 kg)  12/01/13 293 lb 12 oz (133.244 kg)  05/05/13 293 lb (132.904 kg)   BP Readings from Last 3 Encounters:  04/23/14 124/80  12/01/13 116/84  05/05/13 118/80    Review of Systems  Constitutional: Negative for fever and fatigue.  HENT: Negative for ear pain.  Eyes: Negative for pain.  Respiratory: Negative for chest tightness and shortness of breath.  Cardiovascular: Negative for chest pain, palpitations and leg swelling.  Gastrointestinal: Negative for nausea, abdominal pain, diarrhea, constipation and anal bleeding.  Genitourinary: Negative for dysuria.  Neurological: Positive for headaches. Negative for dizziness, seizures, syncope, speech difficulty, weakness and light-headedness.  Psychiatric/Behavioral: Negative for dysphoric mood.  Objective:   Physical Exam  Constitutional: She  is oriented to person, place, and time. Vital signs are normal. She appears well-developed and well-nourished. She is cooperative. Non-toxic appearance. She does not appear ill. No distress.  HENT:  Head: Normocephalic.  Right Ear: Hearing, tympanic membrane, external ear and ear canal normal.  Left Ear: Hearing, tympanic membrane, external ear and ear canal normal.  Nose: Nose normal.  Eyes: Conjunctivae, EOM and lids are normal. Pupils are equal, round, and reactive to light. No foreign bodies found.  Neck: Trachea normal and normal range of motion. Neck supple. Carotid bruit is not present. No mass and no thyromegaly present.  Cardiovascular: Normal rate, regular rhythm, S1 normal, S2 normal, normal heart sounds and intact distal pulses. Exam reveals no gallop.  No murmur heard.  Pulmonary/Chest: Effort normal and breath sounds normal. No respiratory distress. She has no wheezes. She has no rhonchi. She has no rales.  Abdominal: Soft. Normal appearance and bowel sounds are normal. She exhibits no distension, no fluid wave, no abdominal bruit and no mass. There is no hepatosplenomegaly. There is no tenderness. There is no rebound, no guarding and no CVA tenderness. No hernia.  Genitourinary: Vagina normal and uterus normal. No breast swelling, tenderness, discharge or bleeding. Pelvic exam was performed with patient supine. There is no rash, tenderness or lesion on the right labia. There is no rash, tenderness or lesion on the left labia. Uterus is not enlarged and not tender. Right adnexum displays no mass, no tenderness and no fullness. Left adnexum displays no mass, no tenderness and no fullness.  NO PAP  Lymphadenopathy:  She has no cervical adenopathy.  She has no axillary adenopathy.  Neurological: She is alert and oriented to person, place, and time. She has normal strength and normal reflexes. No cranial nerve deficit or sensory deficit. Coordination and gait normal.  Skin: Skin is warm,  dry and intact. No rash noted.  Psychiatric: Her speech is normal and behavior is normal. Judgment normal. Her mood appears not anxious. Cognition and memory are normal. She does not exhibit a depressed mood.  Assessment & Plan:   The patient's preventative maintenance and recommended screening tests for an annual wellness exam were reviewed in full today.  Brought up to date unless services declined.  Counselled on the importance of diet, exercise, and its role in overall health and mortality.   The patient's FH and SH was reviewed, including their home life, tobacco status, and drug and alcohol status.   Vaccines: due tdap. Mammo: start age 68. NO early family breast cancer. PAP/DVE: nml 04/2013, on q3 years, yearly DVE.

## 2014-06-18 NOTE — Patient Instructions (Addendum)
Start zyrtec daily. Trial of prilosec 40 mg x 4 weeks then taper down.  Avoid reflux triggers.  Increase amount exercise. Work on low cholesterol diet. Start vit D x 12  Weeks, followed by OTC vit D 3 400IU twice daily.  Start kegels 3 sets of 15 daily You can make appt for weight loss check in 1-2 months if  interested.

## 2014-06-18 NOTE — Assessment & Plan Note (Signed)
UA clear today. No infeciton.  Start kegels 3 sets of 15 daily

## 2014-06-18 NOTE — Assessment & Plan Note (Signed)
Well controlled. Continue current medication.  

## 2014-08-05 ENCOUNTER — Encounter: Payer: Self-pay | Admitting: Family Medicine

## 2014-08-05 ENCOUNTER — Ambulatory Visit (INDEPENDENT_AMBULATORY_CARE_PROVIDER_SITE_OTHER): Payer: BC Managed Care – PPO | Admitting: Family Medicine

## 2014-08-05 VITALS — BP 128/78 | HR 77 | Temp 97.9°F | Wt 294.5 lb

## 2014-08-05 DIAGNOSIS — J0100 Acute maxillary sinusitis, unspecified: Secondary | ICD-10-CM

## 2014-08-05 MED ORDER — DOXYCYCLINE HYCLATE 100 MG PO TABS: 100.0000 mg | ORAL_TABLET | Freq: Two times a day (BID) | ORAL | Status: DC

## 2014-08-05 NOTE — Patient Instructions (Signed)
Use nasal saline and then flonase.  Start doxycycline.  This should gradually improve.  Take care.  Let us know if you have other concerns.

## 2014-08-05 NOTE — Assessment & Plan Note (Signed)
Nontoxic, use nasal saline and then flonase. Start doxycycline.  D/w pt.  F/u prn.  She agrees.

## 2014-08-05 NOTE — Progress Notes (Signed)
Pre visit review using our clinic review tool, if applicable. No additional management support is needed unless otherwise documented below in the visit note.  "Allergy issues" ongoing during the fall.  Worse recently, "more in my chest". Taking guaifenesin with phenylephrine over the last two weeks.  Some cough and sputum.  Some sinus pain.  Pain with a deep breath.   No fevers.  No ear pain.  No ST. Some post nasal gtt.    Meds, vitals, and allergies reviewed.   ROS: See HPI.  Otherwise, noncontributory.  GEN: nad, alert and oriented HEENT: mucous membranes moist, tm w/o erythema, nasal exam w/o erythema, clear discharge noted,  OP with cobblestoning, max sinuses ttp B NECK: supple w/o LA CV: rrr.   PULM: ctab, no inc wob EXT: no edema SKIN: no acute rash

## 2014-10-08 HISTORY — PX: ESOPHAGOGASTRODUODENOSCOPY: SHX1529

## 2014-10-08 HISTORY — PX: COLONOSCOPY: SHX174

## 2014-10-20 ENCOUNTER — Ambulatory Visit: Payer: Self-pay | Admitting: Counselor

## 2014-10-21 ENCOUNTER — Ambulatory Visit (INDEPENDENT_AMBULATORY_CARE_PROVIDER_SITE_OTHER): Payer: BLUE CROSS/BLUE SHIELD | Admitting: Family Medicine

## 2014-10-21 ENCOUNTER — Encounter: Payer: Self-pay | Admitting: Family Medicine

## 2014-10-21 VITALS — BP 120/82 | HR 87 | Temp 99.0°F | Ht 71.5 in | Wt 292.5 lb

## 2014-10-21 DIAGNOSIS — R1013 Epigastric pain: Secondary | ICD-10-CM | POA: Insufficient documentation

## 2014-10-21 DIAGNOSIS — K760 Fatty (change of) liver, not elsewhere classified: Secondary | ICD-10-CM

## 2014-10-21 DIAGNOSIS — K828 Other specified diseases of gallbladder: Secondary | ICD-10-CM

## 2014-10-21 NOTE — Assessment & Plan Note (Signed)
Work on [employer] fat diet, weight loss and exercise.

## 2014-10-21 NOTE — Patient Instructions (Addendum)
Continue prilosec 40 mg daily. Add zantac twice a day for the next few days.  Eat bland foods, avoid tomato, spicy, alcohol, caffeine, citris, peppermint. Stay of prednisone.  Work on low fat diet ( avoid fatty foods), exercsie and weight loss for fatty liver.  If not improving in 1 week or so, call for further recommendations.

## 2014-10-21 NOTE — Assessment & Plan Note (Signed)
Most likely secondary to gastritis/ SE to prednisone. Stop prednisone, add H2 blocker to PPI. Avoid acid foods.  Consider further eval such as labs etc if not improving in 1-2 weeks.

## 2014-10-21 NOTE — Assessment & Plan Note (Signed)
No clearly the cause of symptoms, but if not improving after 1-2 week consider referral to surgeon.

## 2014-10-21 NOTE — Progress Notes (Signed)
Pre visit review using our clinic review tool, if applicable. No additional management support is needed unless otherwise documented below in the visit note. 

## 2014-10-21 NOTE — Progress Notes (Signed)
   Subjective:    Patient ID: Bailey Andrews, female    DOB: 03/18/1976, 39 y.o.   MRN: [id number]  HPI  39 year old female presents for follow up of urgent care visit for   Epigastric abdominal pain radiates to RUQ/back. She was seen on 1/9 for bronchitis and wheeze ( prednisone,antibioitcs, cough med). Returned for follow up on 1/13 with new onset x 2 days of pain in RUQ/epigastrum. Pain is intermittent. Occurred few hours after eating.  She has noted a lot of gas. Increase in GERD, no fever, no N/V/occ diarrhea. No particular pain after greasy foods.  Has not taking any med to help.  On prilosec for GERD, previously well controlled  US showed showed sludge, and fatty liver.  Urgent care recommended stopping prednisone (last dose was 1/12)  and follow up with PCP.  Sinus symptoms are improving.  Review of Systems  All other systems reviewed and are negative.      Objective:   Physical Exam  Constitutional: Vital signs are normal. She appears well-developed and well-nourished. She is cooperative.  Non-toxic appearance. She does not appear ill. No distress.  HENT:  Head: Normocephalic.  Right Ear: Hearing, tympanic membrane, external ear and ear canal normal. Tympanic membrane is not erythematous, not retracted and not bulging.  Left Ear: Hearing, tympanic membrane, external ear and ear canal normal. Tympanic membrane is not erythematous, not retracted and not bulging.  Nose: No mucosal edema or rhinorrhea. Right sinus exhibits no maxillary sinus tenderness and no frontal sinus tenderness. Left sinus exhibits no maxillary sinus tenderness and no frontal sinus tenderness.  Mouth/Throat: Uvula is midline, oropharynx is clear and moist and mucous membranes are normal.  Eyes: Conjunctivae, EOM and lids are normal. Pupils are equal, round, and reactive to light. Lids are everted and swept, no foreign bodies found.  Neck: Trachea normal and normal range of motion. Neck supple. Carotid  bruit is not present. No thyroid mass and no thyromegaly present.  Cardiovascular: Normal rate, regular rhythm, S1 normal, S2 normal, normal heart sounds, intact distal pulses and normal pulses.  Exam reveals no gallop and no friction rub.   No murmur heard. Pulmonary/Chest: Effort normal and breath sounds normal. No tachypnea. No respiratory distress. She has no decreased breath sounds. She has no wheezes. She has no rhonchi. She has no rales.  Abdominal: Soft. Normal appearance and bowel sounds are normal. There is no hepatosplenomegaly. There is tenderness in the epigastric area. There is no rigidity, no rebound and no guarding.  Neurological: She is alert.  Skin: Skin is warm, dry and intact. No rash noted.  Psychiatric: Her speech is normal and behavior is normal. Judgment and thought content normal. Her mood appears not anxious. Cognition and memory are normal. She does not exhibit a depressed mood.          Assessment & Plan:

## 2014-11-01 ENCOUNTER — Encounter: Payer: Self-pay | Admitting: Family Medicine

## 2014-11-05 ENCOUNTER — Ambulatory Visit (INDEPENDENT_AMBULATORY_CARE_PROVIDER_SITE_OTHER): Payer: BLUE CROSS/BLUE SHIELD | Admitting: Family Medicine

## 2014-11-05 ENCOUNTER — Encounter: Payer: Self-pay | Admitting: Family Medicine

## 2014-11-05 VITALS — BP 107/74 | HR 74 | Temp 98.2°F | Ht 71.5 in | Wt 284.2 lb

## 2014-11-05 DIAGNOSIS — K219 Gastro-esophageal reflux disease without esophagitis: Secondary | ICD-10-CM

## 2014-11-05 DIAGNOSIS — R1013 Epigastric pain: Secondary | ICD-10-CM

## 2014-11-05 DIAGNOSIS — K828 Other specified diseases of gallbladder: Secondary | ICD-10-CM

## 2014-11-05 LAB — CBC WITH DIFFERENTIAL/PLATELET
BASOS ABS: 0 10*3/uL (ref 0.0–0.1)
BASOS PCT: 0.7 % (ref 0.0–3.0)
EOS ABS: 0.2 10*3/uL (ref 0.0–0.7)
Eosinophils Relative: 5.5 % — ABNORMAL HIGH (ref 0.0–5.0)
HEMATOCRIT: 37 % (ref 36.0–46.0)
HEMOGLOBIN: 12.5 g/dL (ref 12.0–15.0)
Lymphocytes Relative: 25.1 % (ref 12.0–46.0)
Lymphs Abs: 0.9 10*3/uL (ref 0.7–4.0)
MCHC: 33.9 g/dL (ref 30.0–36.0)
MCV: 83.3 fl (ref 78.0–100.0)
Monocytes Absolute: 0.3 10*3/uL (ref 0.1–1.0)
Monocytes Relative: 9.1 % (ref 3.0–12.0)
Neutro Abs: 2.1 10*3/uL (ref 1.4–7.7)
Neutrophils Relative %: 59.6 % (ref 43.0–77.0)
Platelets: 167 10*3/uL (ref 150.0–400.0)
RBC: 4.44 Mil/uL (ref 3.87–5.11)
RDW: 14.2 % (ref 11.5–15.5)
WBC: 3.5 10*3/uL — ABNORMAL LOW (ref 4.0–10.5)

## 2014-11-05 LAB — COMPREHENSIVE METABOLIC PANEL
ALT: 24 U/L (ref 0–35)
AST: 20 U/L (ref 0–37)
Albumin: 4.2 g/dL (ref 3.5–5.2)
Alkaline Phosphatase: 65 U/L (ref 39–117)
BUN: 12 mg/dL (ref 6–23)
CALCIUM: 9.2 mg/dL (ref 8.4–10.5)
CO2: 30 mEq/L (ref 19–32)
Chloride: 103 mEq/L (ref 96–112)
Creatinine, Ser: 0.9 mg/dL (ref 0.40–1.20)
GFR: 74.55 mL/min (ref >60.00)
GLUCOSE: 95 mg/dL (ref 70–99)
POTASSIUM: 3.8 meq/L (ref 3.5–5.1)
SODIUM: 139 meq/L (ref 135–145)
Total Bilirubin: 0.7 mg/dL (ref 0.2–1.2)
Total Protein: 7.1 g/dL (ref 6.0–8.3)

## 2014-11-05 LAB — LIPASE: LIPASE: 15 U/L (ref 11.0–59.0)

## 2014-11-05 MED ORDER — PANTOPRAZOLE SODIUM 40 MG PO TBEC: 40.0000 mg | DELAYED_RELEASE_TABLET | Freq: Every day | ORAL | Status: DC

## 2014-11-05 NOTE — Progress Notes (Signed)
   Subjective:    Patient ID: Bailey Andrews, female    DOB: 10/22/1975, 39 y.o.   MRN: [id number]  HPI 39 year old female presents for follow up on abdominal pain.  Last OV 10/21/2014. Summary of last OV:  Most likely secondary to gastritis/SE to prednisone. Stop prednisone, add H2 blocker to PPI. Avoid acid foods. Consider further eval such as labs etc if not improving in 1-2 weeks. US showed gallbladder sludge.     She reports today that she continues to have pain in epigastrium despite being off prednisone.  Pain is better overall, but has been eating a lot of plain foods. She has stopped drinking tea and eating as much fast food. Constantly there, worse after meals... After meal/french fries from [employer] she had symptoms.  Using prilosec 40 mg daily,  Zantac twice daily.  Wt Readings from Last 3 Encounters:  11/05/14 284 lb 4 oz (128.935 kg)  10/21/14 292 lb 8 oz (132.677 kg)  08/05/14 294 lb 8 oz (133.584 kg)   Not taking NSAIDs lately.   Review of Systems  Constitutional: Negative for fever and fatigue.  HENT: Negative for ear pain.   Eyes: Negative for pain.  Respiratory: Negative for chest tightness and shortness of breath.   Cardiovascular: Negative for chest pain, palpitations and leg swelling.  Gastrointestinal: Negative for abdominal pain.  Genitourinary: Negative for dysuria.       Objective:   Physical Exam  Constitutional: Vital signs are normal. She appears well-developed and well-nourished. She is cooperative.  Non-toxic appearance. She does not appear ill. No distress.  HENT:  Head: Normocephalic.  Right Ear: Hearing, tympanic membrane, external ear and ear canal normal. Tympanic membrane is not erythematous, not retracted and not bulging.  Left Ear: Hearing, tympanic membrane, external ear and ear canal normal. Tympanic membrane is not erythematous, not retracted and not bulging.  Nose: No mucosal edema or rhinorrhea. Right sinus exhibits no maxillary  sinus tenderness and no frontal sinus tenderness. Left sinus exhibits no maxillary sinus tenderness and no frontal sinus tenderness.  Mouth/Throat: Uvula is midline, oropharynx is clear and moist and mucous membranes are normal.  Eyes: Conjunctivae, EOM and lids are normal. Pupils are equal, round, and reactive to light. Lids are everted and swept, no foreign bodies found.  Neck: Trachea normal and normal range of motion. Neck supple. Carotid bruit is not present. No thyroid mass and no thyromegaly present.  Cardiovascular: Normal rate, regular rhythm, S1 normal, S2 normal, normal heart sounds, intact distal pulses and normal pulses.  Exam reveals no gallop and no friction rub.   No murmur heard. Pulmonary/Chest: Effort normal and breath sounds normal. No tachypnea. No respiratory distress. She has no decreased breath sounds. She has no wheezes. She has no rhonchi. She has no rales.  Abdominal: Soft. Normal appearance and bowel sounds are normal. There is tenderness in the epigastric area.  Neurological: She is alert.  Skin: Skin is warm, dry and intact. No rash noted.  Psychiatric: Her speech is normal and behavior is normal. Judgment and thought content normal. Her mood appears not anxious. Cognition and memory are normal. She does not exhibit a depressed mood.          Assessment & Plan:

## 2014-11-05 NOTE — Progress Notes (Signed)
Pre visit review using our clinic review tool, if applicable. No additional management support is needed unless otherwise documented below in the visit note. 

## 2014-11-05 NOTE — Assessment & Plan Note (Signed)
Will eval with labs for liver issues, pancreatitis, sign of infection and HPylori.Change to  Pantoprazole 40 mg daily, along with zantac.  If labs neg consider endoscopy vs referral to surgeon for gallbladder eval.

## 2014-11-05 NOTE — Patient Instructions (Addendum)
Stop prilosec and change to pantoprazole 40 mg daily. Continue zantac twice daily.  Stop at lab on way out.  Go to ER if severe pain.

## 2014-11-08 NOTE — Addendum Note (Signed)
Addended by: Alvina ChouWALSH, Aluel Schwarz J on: 11/08/2014 08:06 AM   Modules accepted: Orders

## 2014-11-09 LAB — HELICOBACTER PYLORI  SPECIAL ANTIGEN: H. PYLORI Antigen: NEGATIVE

## 2014-11-23 ENCOUNTER — Other Ambulatory Visit: Payer: Self-pay | Admitting: Family Medicine

## 2015-01-07 ENCOUNTER — Encounter: Payer: Self-pay | Admitting: Family Medicine

## 2015-01-07 ENCOUNTER — Ambulatory Visit (INDEPENDENT_AMBULATORY_CARE_PROVIDER_SITE_OTHER): Payer: BLUE CROSS/BLUE SHIELD | Admitting: Family Medicine

## 2015-01-07 VITALS — BP 132/84 | HR 88 | Temp 98.5°F | Resp 18 | Ht 71.5 in | Wt 271.0 lb

## 2015-01-07 DIAGNOSIS — H6993 Unspecified Eustachian tube disorder, bilateral: Secondary | ICD-10-CM

## 2015-01-07 DIAGNOSIS — H6983 Other specified disorders of Eustachian tube, bilateral: Secondary | ICD-10-CM | POA: Diagnosis not present

## 2015-01-07 DIAGNOSIS — R1013 Epigastric pain: Secondary | ICD-10-CM | POA: Diagnosis not present

## 2015-01-07 DIAGNOSIS — M542 Cervicalgia: Secondary | ICD-10-CM | POA: Diagnosis not present

## 2015-01-07 MED ORDER — CYCLOBENZAPRINE HCL 10 MG PO TABS: 5.0000 mg | ORAL_TABLET | Freq: Every day | ORAL | Status: DC

## 2015-01-07 NOTE — Patient Instructions (Addendum)
Start taking the muscle relaxant at bedtime as needed.  Use a nasal steroid spray (Flonase, available over the counter) 2 sprays per nostril per day. Call if your symptoms don't improve in about 2 weeks Stop at front desk to set up referral to GI

## 2015-01-07 NOTE — Progress Notes (Signed)
Pre visit review using our clinic review tool, if applicable. No additional management support is needed unless otherwise documented below in the visit note. 

## 2015-01-07 NOTE — Progress Notes (Addendum)
Subjective:     Patient ID: Bailey Andrews, female   DOB: 06/07/1976, 39 y.o.   MRN: [id number]  HPI Ms. Bailey Andrews is a 39 y/o F with a PMH of GERD, fatty liver, and hypothyroidism presenting with neck pain and arm pain for the past few weeks and getting worse over the past 3 days. Pain is described as sharp pain radiating from the neck down to the lateral upper aspect of the arm, rated 4/10. Pain is on and off, felt in both arms, but worse on the right side. Denies SOB, no chest pain. Endorses some mid epigastric pain occuring with the shooting pain in the neck/arm. Thinks pain may be related to position, neck bothers her more when lying down on the right side. Has taken ibuprofen occasionally for the pain with some relief, reluctant to continue taking due to hx of GERD. Can't recall any injuries to the neck or arm.    Also having stabbing pains in both ears, has been going on for 1 week.   Protonix has helped with GERD, still taking. Has been taking for 2 months now. Endorses some mid-epigastric pain.  Father has a hx of heart disease.  Review of Systems  Constitutional: Negative for fever.  HENT: Positive for congestion. Negative for sinus pressure.   Respiratory: Positive for cough. Negative for shortness of breath.   Cardiovascular: Negative for chest pain.  Gastrointestinal: Positive for abdominal pain.  Musculoskeletal: Positive for neck pain.  Neurological: Positive for numbness. Negative for weakness.       Objective:   Physical Exam  Constitutional: She is oriented to person, place, and time. She appears well-developed and well-nourished.  HENT:  Head: Normocephalic and atraumatic.  Right Ear: External ear normal. Tympanic membrane is not erythematous.  Left Ear: External ear normal. Tympanic membrane is not erythematous.  Mouth/Throat: Oropharynx is clear and moist and mucous membranes are normal. No oropharyngeal exudate.  Small amount of fluid present in both ears  Neck:  Normal range of motion. Muscular tenderness present. No spinous process tenderness present. Carotid bruit is not present.  Tenderness over trapezius. Negative Sperling's test  Cardiovascular: Normal rate, regular rhythm, S1 normal and S2 normal.  Exam reveals no gallop and no friction rub.   No murmur heard. Pulmonary/Chest: Effort normal and breath sounds normal.  Neurological: She is alert and oriented to person, place, and time. She has normal strength. No sensory deficit.  Pain reproduced in right arm with strength testing of the UE Normal strength in bilateral UE     Assessment:       1. Neck pain: Most likely musculoskeletal cause. Herniated disc less likely due to negative Spurling's test.  2. Ear pain: Small amount of fluid bilaterally behind TMs on exam. No indications of infection. Possibly due to eustachian tube dysfunction.  3. GERD, epigastric pain: Still uncontrolled on Protonix.    Plan:     1. Neck pain: Will try a muscle relaxant at bedtime as needed and home physical therapy. Follow up in 2 weeks if no improvement.  2. Ear pain: Can try nasal steroid spray for relief.    3. GERD, epigastric pain: Refer to GI for endoscopy.   Ruthe MannanAlexis Quinnetta Roepke served as Neurosurgeonscribe for Dr. Ermalene SearingBedsole 01/07/15 9:30 am   Patient seen and examined. Med student acted as Neurosurgeonscribe only.  HPI/ROS/PE and assessment/plan created  By MD and scribed by med student.  Kerby NoraAmy Bedsole MD

## 2015-01-20 ENCOUNTER — Encounter: Payer: Self-pay | Admitting: Nurse Practitioner

## 2015-01-20 ENCOUNTER — Ambulatory Visit (INDEPENDENT_AMBULATORY_CARE_PROVIDER_SITE_OTHER): Payer: BLUE CROSS/BLUE SHIELD | Admitting: Nurse Practitioner

## 2015-01-20 VITALS — BP 126/64 | HR 76 | Ht 71.75 in | Wt 271.5 lb

## 2015-01-20 DIAGNOSIS — K625 Hemorrhage of anus and rectum: Secondary | ICD-10-CM

## 2015-01-20 DIAGNOSIS — K219 Gastro-esophageal reflux disease without esophagitis: Secondary | ICD-10-CM | POA: Diagnosis not present

## 2015-01-20 DIAGNOSIS — R1010 Upper abdominal pain, unspecified: Secondary | ICD-10-CM | POA: Diagnosis not present

## 2015-01-20 DIAGNOSIS — K648 Other hemorrhoids: Secondary | ICD-10-CM

## 2015-01-20 MED ORDER — POLYETHYLENE GLYCOL 3350 17 GM/SCOOP PO POWD: ORAL | Status: DC

## 2015-01-20 NOTE — Patient Instructions (Signed)

## 2015-01-20 NOTE — Progress Notes (Signed)
HPI :   Patient is a 39 year old female referred by PCP for evaluation of upper abdominal pain.   Patient gives a 2 year history of heartburn. She has taken PPIs as needed. In January she was seen at urgent care for upper abdominal pain radiating through to the back. Right upper quadrant ultrasound showed small amount of sludge without gallbladder wall thickening. Normal common bile duct. Pain persisted, patient eventually saw PCP who changed her PPI and added an H2 blocker. Patient has been on this regimen for 3-4 weeks now and heartburn has improved and she continues to have intermittent upper abdominal pain. The pain is not related to eating. She has some associated nausea. Episodes of pain can last several hours and occur about twice a week. Pain is usually epigastric but sometimes in the left upper quadrant, other times in the right upper quadrant. The pain is sometimes stabbing, other times burning. H. pylori negative. CMET and hgb normal. Eosinophils elevated at 5.5  Patient endorses occasional rectal bleeding which has been going on for years. She has occasional shooting rectal pains. Her bowel movement are normal.  Past Medical History  Diagnosis Date  . Anal fissure   . Hypertension   . Hyperlipidemia   . Thyroid disease     Family History  Problem Relation Age of Onset  . Diabetes Mother   . Hyperlipidemia Mother   . Hypertension Mother   . Hyperlipidemia Father   . Hypertension Father   . Diabetes Father   . Alcohol abuse Father   . Arthritis Father   . Heart disease Father 8    CABG  . Diabetes Brother   . Hyperlipidemia Brother   . Hypertension Brother   . Hearing loss Maternal Grandfather   . Hearing loss Paternal Grandfather   . Colon cancer Neg Hx   . Colon polyps Paternal Grandmother   . Lymphoma Maternal Aunt   . Gallbladder disease Neg Hx   . Kidney disease Neg Hx    History  Substance Use Topics  . Smoking status: Never Smoker   . Smokeless  tobacco: Never Used  . Alcohol Use: No   Current Outpatient Prescriptions  Medication Sig Dispense Refill  . cyclobenzaprine (FLEXERIL) 10 MG tablet Take 0.5-1 tablets (5-10 mg total) by mouth at bedtime. (Patient taking differently: Take 5-10 mg by mouth as needed. ) 30 tablet 0  . hydrochlorothiazide (HYDRODIURIL) 25 MG tablet TAKE 1 TABLET BY MOUTH DAILY 30 tablet 11  . levothyroxine (SYNTHROID, LEVOTHROID) 150 MCG tablet TAKE 1 TABLET (150 MCG TOTAL) BY MOUTH DAILY. 30 tablet 11  . pantoprazole (PROTONIX) 40 MG tablet Take 1 tablet (40 mg total) by mouth daily. 30 tablet 3  . ranitidine (ZANTAC) 150 MG tablet Take 150 mg by mouth 2 (two) times daily.     No current facility-administered medications for this visit.   Allergies  Allergen Reactions  . Penicillins     hives     Review of Systems: Allergy/sinus trouble, muscle pain, sore throat, swelling of the feet and legs, and excessive thirst. All systems reviewed and negative except where noted in HPI.   Physical Exam: BP 126/64 mmHg  Pulse 76  Ht 5' 11.75" (1.822 m)  Wt 271 lb 8 oz (123.152 kg)  BMI 37.10 kg/m2  LMP 12/24/2014 Constitutional: Pleasant, obese, white female in no acute distress. HEENT: Normocephalic and atraumatic. Conjunctivae are normal. No scleral icterus. Neck supple.  Cardiovascular: Normal rate, regular rhythm.  Pulmonary/chest: Effort  normal and breath sounds normal. No wheezing, rales or rhonchi. Abdominal: Soft, nondistended, nontender. Bowel sounds active throughout. There are no masses palpable. No hepatomegaly. Rectal: no external lesion. Soft, light brown, heme negative stool in vault. Small internal hemorrhoids on anoscopy Extremities: no edema Lymphadenopathy: No cervical adenopathy noted. Neurological: Alert and oriented to person place and time. Skin: Skin is warm and dry. No rashes noted. Psychiatric: Normal mood and affect. Behavior is normal.   ASSESSMENT AND PLAN:  661. 39 year old  female with several month history of intermittent upper abdominal pain, sometimes radiating through to the back with associated nausea. Etiology unclear. She has gallbladder sludge. Ultrasound otherwise normal. Her liver function studies and lipase are normal. Peptic ulcer disease possible but also consider nonulcer dyspepsia or musculoskeletal pain. For further evaluation patient will be scheduled for upper endoscopy. The benefits, risks, and potential complications of EGD with possible biopsies were discussed with the patient and she agrees to proceed.   2. Chronic intermittent rectal bleeding. She has some small hemorrhoids on anoscopy which are likely the source of bleeding. Other etiologies of bleeding should be excluded. Patient agrees to proceed with colonoscopy. The risks, benefits, and alternatives to colonoscopy with possible biopsy and possible polypectomy were discussed with the patient and she consents to proceed.   3.  Hepatic steatosis, normal LFTs.   4. Obesity  CC: Kerby NoraAmy Bedsole, MD

## 2015-01-21 ENCOUNTER — Encounter: Payer: Self-pay | Admitting: Nurse Practitioner

## 2015-01-26 NOTE — Progress Notes (Signed)
Agree with Ms. Guenther's assessment and plan. Bailey Andrews E. Seymour Pavlak, MD, [facility]  Will keep possible eosinophilic gastritis/esophagitis in mind given elevated eosinophils.

## 2015-02-01 ENCOUNTER — Encounter: Payer: Self-pay | Admitting: Internal Medicine

## 2015-02-03 ENCOUNTER — Other Ambulatory Visit: Payer: Self-pay | Admitting: Family Medicine

## 2015-02-03 NOTE — Telephone Encounter (Signed)
Last office visit 01/07/2015.  Last refilled 01/07/2015 for #30 with no refills.  Ok to refill?

## 2015-03-01 ENCOUNTER — Ambulatory Visit (AMBULATORY_SURGERY_CENTER): Payer: BLUE CROSS/BLUE SHIELD | Admitting: Internal Medicine

## 2015-03-01 ENCOUNTER — Encounter: Payer: Self-pay | Admitting: Internal Medicine

## 2015-03-01 VITALS — BP 123/78 | HR 68 | Temp 98.1°F | Resp 19 | Ht 71.0 in | Wt 271.0 lb

## 2015-03-01 DIAGNOSIS — K259 Gastric ulcer, unspecified as acute or chronic, without hemorrhage or perforation: Secondary | ICD-10-CM | POA: Diagnosis not present

## 2015-03-01 DIAGNOSIS — K219 Gastro-esophageal reflux disease without esophagitis: Secondary | ICD-10-CM | POA: Diagnosis present

## 2015-03-01 DIAGNOSIS — R1013 Epigastric pain: Secondary | ICD-10-CM | POA: Diagnosis not present

## 2015-03-01 DIAGNOSIS — K649 Unspecified hemorrhoids: Secondary | ICD-10-CM | POA: Diagnosis not present

## 2015-03-01 DIAGNOSIS — K625 Hemorrhage of anus and rectum: Secondary | ICD-10-CM

## 2015-03-01 DIAGNOSIS — K2950 Unspecified chronic gastritis without bleeding: Secondary | ICD-10-CM | POA: Diagnosis not present

## 2015-03-01 DIAGNOSIS — K317 Polyp of stomach and duodenum: Secondary | ICD-10-CM | POA: Diagnosis not present

## 2015-03-01 MED ORDER — SODIUM CHLORIDE 0.9 % IV SOLN: 500.0000 mL | INTRAVENOUS | Status: DC

## 2015-03-01 MED ORDER — HYDROCORTISONE 2.5 % RE CREA: 1.0000 "application " | TOPICAL_CREAM | Freq: Two times a day (BID) | RECTAL | Status: DC | PRN

## 2015-03-01 NOTE — Progress Notes (Signed)
Called to room to assist during endoscopic procedure.  Patient ID and intended procedure confirmed with present staff. Received instructions for my participation in the procedure from the performing physician.  

## 2015-03-01 NOTE — Patient Instructions (Addendum)
I saw some stomach polyps and a small erosion (almost an ulcer) in the stomach. These findings may not have any relation to your symptoms. I will have my office call with biopsy results.  I saw hemorrhoids in the anal canal and they were slightly irritated and bleeding.No other abnormalities in the colon or rectum.  I have prescribed some hydrocortisone cream for the hemorrhoids.  I appreciate the opportunity to care for you. Iva Booparl E. Rustyn Conery, MD, FACG YOU HAD AN ENDOSCOPIC PROCEDURE TODAY AT THE [facility]:   Refer to the procedure report that was given to you for any specific questions about what was found during the examination.  If the procedure report does not answer your questions, please call your gastroenterologist to clarify.  If you requested that your care partner not be given the details of your procedure findings, then the procedure report has been included in a sealed envelope for you to review at your convenience later.  YOU SHOULD EXPECT: Some feelings of bloating in the abdomen. Passage of more gas than usual.  Walking can help get rid of the air that was put into your GI tract during the procedure and reduce the bloating. If you had a lower endoscopy (such as a colonoscopy or flexible sigmoidoscopy) you may notice spotting of blood in your stool or on the toilet paper. If you underwent a bowel prep for your procedure, you may not have a normal bowel movement for a few days.  Please Note:  You might notice some irritation and congestion in your nose or some drainage.  This is from the oxygen used during your procedure.  There is no need for concern and it should clear up in a day or so.  SYMPTOMS TO REPORT IMMEDIATELY:   Following lower endoscopy (colonoscopy or flexible sigmoidoscopy):  Excessive amounts of blood in the stool  Significant tenderness or worsening of abdominal pains  Swelling of the abdomen that is new, acute  Fever of 100F or  higher   Following upper endoscopy (EGD)  Vomiting of blood or coffee ground material  New chest pain or pain under the shoulder blades  Painful or persistently difficult swallowing  New shortness of breath  Fever of 100F or higher  Black, tarry-looking stools  For urgent or emergent issues, a gastroenterologist can be reached at any hour by calling [phone].   DIET: Your first meal following the procedure should be a small meal and then it is ok to progress to your normal diet. Heavy or fried foods are harder to digest and may make you feel nauseous or bloated.  Likewise, meals heavy in dairy and vegetables can increase bloating.  Drink plenty of fluids but you should avoid alcoholic beverages for 24 hours.  ACTIVITY:  You should plan to take it easy for the rest of today and you should NOT DRIVE or use heavy machinery until tomorrow (because of the sedation medicines used during the test).    FOLLOW UP: Our staff will call the number listed on your records the next business day following your procedure to check on you and address any questions or concerns that you may have regarding the information given to you following your procedure. If we do not reach you, we will leave a message.  However, if you are feeling well and you are not experiencing any problems, there is no need to return our call.  We will assume that you have returned to your regular daily activities  without incident.  If any biopsies were taken you will be contacted by phone or by letter within the next 1-3 weeks.  Please call us at [phone] if you have not heard about the biopsies in 3 weeks.    SIGNATURES/CONFIDENTIALITY: You and/or your care partner have signed paperwork which will be entered into your electronic medical record.  These signatures attest to the fact that that the information above on your After Visit Summary has been reviewed and is understood.  Full responsibility of the confidentiality of  this discharge information lies with you and/or your care-partner.

## 2015-03-01 NOTE — Op Note (Signed)
[facility] [address] [place], [id number]   COLONOSCOPY PROCEDURE REPORT  PATIENT: Greer EeWhitsett, Michaline G  MR#: [id number] BIRTHDATE: 1976/08/05 , 39  yrs. old GENDER: female ENDOSCOPIST: Iva Booparl E Marcellis Frampton, MD, [facility] PROCEDURE DATE:  03/01/2015 PROCEDURE:   Colonoscopy, diagnostic First Screening Colonoscopy - Avg.  risk and is 50 yrs.  old or older - No.  Prior Negative Screening - Now for repeat screening. N/A  History of Adenoma - Now for follow-up colonoscopy & has been > or = to 3 yrs.  N/A  Polyps removed today? No Recommend repeat exam, <10 yrs? No ASA CLASS:   Class II INDICATIONS:Evaluation of unexplained GI bleeding and Patient is not applicable for Colorectal Neoplasm Risk Assessment for this procedure. MEDICATIONS: Propofol 120 mg IV and Monitored anesthesia care  DESCRIPTION OF PROCEDURE:   After the risks benefits and alternatives of the procedure were thoroughly explained, informed consent was obtained.  The digital rectal exam revealed external hemorrhoids and revealed no rectal mass.   The LB CF-[id number] Loaner [id number]  endoscope was introduced through the anus and advanced to the cecum, which was identified by both the appendix and ileocecal valve. No adverse events experienced.   The quality of the prep was excellent.  (MiraLax was used)  The instrument was then slowly withdrawn as the colon was fully examined. Estimated blood loss is zero unless otherwise noted in this procedure report.      COLON FINDINGS: External and internal hemorrhoids were found.   The colonic mucosa appeared normal throughout the entire examined colon.  Retroflexed views revealed internal hemorrhoids and Retroflexed views revealed external hemorrhoids. The time to cecum = 2.9 Withdrawal time = 6.0   The scope was withdrawn and the procedure completed. COMPLICATIONS: There were no immediate complications.  ENDOSCOPIC IMPRESSION: 1.   External and internal  hemorrhoids 2.   The colonic mucosa appeared normal throughout the entire examined colon  RECOMMENDATIONS: Hydrocortisone cream 2.5% prn hemorrhoids Has internal and external component and probably inflamed from colon prep  eSigned:  Iva Booparl E Jonathon Tan, MD, [facility]ACG 03/01/2015 3:13 PM   cc: Kerby NoraAmy Bedsole, MD and The Patient

## 2015-03-01 NOTE — Progress Notes (Signed)
Transferred to recovery room. A/O x3, pleased with MAC.  VSS.  Report to Wendy, RN. 

## 2015-03-01 NOTE — Op Note (Signed)
[facility] [address] [place], [id number]   ENDOSCOPY PROCEDURE REPORT  PATIENT: Bailey Andrews, Bailey Andrews  MR#: [id number] BIRTHDATE: 1976/08/08 , 39  yrs. old GENDER: female ENDOSCOPIST: Iva Booparl E Neng Albee, MD, [facility] PROCEDURE DATE:  03/01/2015 PROCEDURE:  EGD w/ biopsy ASA CLASS:     Class II INDICATIONS:  epigastric abdominal pain, abdominal pain in the upper right quadrant, and abdominal pain in upper left quadrant. MEDICATIONS: Propofol 130 mg IV and Monitored anesthesia care TOPICAL ANESTHETIC: none  DESCRIPTION OF PROCEDURE: After the risks benefits and alternatives of the procedure were thoroughly explained, informed consent was obtained.  The LB [id number] endoscope was introduced through the mouth and advanced to the second portion of the duodenum , Without limitations.  The instrument was slowly withdrawn as the mucosa was fully examined.    1) erosion on pre-pyloric fold - biopsied 2) multiple diminutive gastric polyps - look like benign fundic gland polyps 3) otherwise normal EGD.  Retroflexed views revealed no abnormalities.     The scope was then withdrawn from the patient and the procedure completed.  COMPLICATIONS: There were no immediate complications.  ENDOSCOPIC IMPRESSION: 1) erosion on pre-pyloric fold - biopsied 2) multiple diminutive gastric polyps - look like benign fundic gland polyps 3) otherwise normal EGD  RECOMMENDATIONS: 1.  Await biopsy results 2.  Proceed with a Colonoscopy.  eSigned:  Iva Booparl E Teneka Malmberg, MD, [facility] 03/01/2015 3:09 PM    CC:Amy Ermalene SearingBedsole, MD and The Patient

## 2015-03-02 ENCOUNTER — Telehealth: Payer: Self-pay | Admitting: *Deleted

## 2015-03-02 NOTE — Telephone Encounter (Signed)
  Follow up Call-  Call back number 03/01/2015  Post procedure Call Back phone  # [phone]  Permission to leave phone message Yes     Patient questions:  Do you have a fever, pain , or abdominal swelling? No. Pain Score  0 *  Have you tolerated food without any problems? Yes.    Have you been able to return to your normal activities? Yes.    Do you have any questions about your discharge instructions: Diet   No. Medications  No. Follow up visit  No.  Do you have questions or concerns about your Care? No.  Actions: * If pain score is 4 or above: No action needed, pain <4.

## 2015-03-13 ENCOUNTER — Other Ambulatory Visit: Payer: Self-pay | Admitting: Family Medicine

## 2015-03-15 NOTE — Progress Notes (Signed)
Quick Note:  Biopsies do not show a cause for pain Please Rx dicyclomine 20 mg qac # 90 1 RF if still having upper abdominal pain and set up next available f/u ______

## 2015-04-06 ENCOUNTER — Telehealth: Payer: Self-pay | Admitting: Family Medicine

## 2015-04-06 NOTE — Telephone Encounter (Signed)
Already dispositioned.

## 2015-04-06 NOTE — Telephone Encounter (Signed)
Patient Name: Bailey Andrews DOB: 07/11/1976 Initial CGeanie Loganomment Caller states she has been having stomach, side and back pain for last two hours Nurse Assessment Nurse: Yetta BarreJones, RN, Miranda Date/Time (Eastern Time): 04/06/2015 12:42:17 PM Confirm and document reason for call. If symptomatic, describe symptoms. ---Caller states she started having diarrhea yesterday and has intense upper abdominal pain off and on for the last 2-3 hrs. Denies vomiting. Has the patient traveled out of the country within the last 30 days? ---No Does the patient require triage? ---Yes Related visit to physician within the last 2 weeks? ---No Does the PT have any chronic conditions? (i.e. diabetes, asthma, etc.) ---Yes List chronic conditions. ---GERD, Thyroid Did the patient indicate they were pregnant? ---No Guidelines Guideline Title Affirmed Question Affirmed Notes Diarrhea Abdominal pain (Exception: Pain clears with each passage of diarrhea stool) Final Disposition User See Physician within 24 Hours Yetta BarreJones, RN, Miranda Comments Appt scheduled for tomorrow 6/30 at 9:30 am with Dr. Ermalene SearingBedsole. Offered to search other locations for appt today and caller declined, she will call back if she gets worse or wants to be seen today.

## 2015-04-06 NOTE — Telephone Encounter (Signed)
Pt has appt with Dr Ermalene SearingBedsole on 04/07/15 at 9:30 AM.Please advise.

## 2015-04-07 ENCOUNTER — Ambulatory Visit (INDEPENDENT_AMBULATORY_CARE_PROVIDER_SITE_OTHER): Payer: BLUE CROSS/BLUE SHIELD | Admitting: Family Medicine

## 2015-04-07 ENCOUNTER — Encounter: Payer: Self-pay | Admitting: Family Medicine

## 2015-04-07 VITALS — BP 112/78 | HR 76 | Temp 97.8°F | Ht 71.75 in | Wt 262.8 lb

## 2015-04-07 DIAGNOSIS — R1010 Upper abdominal pain, unspecified: Secondary | ICD-10-CM

## 2015-04-07 DIAGNOSIS — R197 Diarrhea, unspecified: Secondary | ICD-10-CM

## 2015-04-07 DIAGNOSIS — K828 Other specified diseases of gallbladder: Secondary | ICD-10-CM

## 2015-04-07 DIAGNOSIS — R1013 Epigastric pain: Secondary | ICD-10-CM | POA: Diagnosis not present

## 2015-04-07 MED ORDER — DICYCLOMINE HCL 20 MG PO TABS
20.0000 mg | ORAL_TABLET | Freq: Three times a day (TID) | ORAL | Status: DC
Start: 2015-04-07 — End: 2023-07-16

## 2015-04-07 NOTE — Assessment & Plan Note (Signed)
Likely due to viral GI illness. Push fluids.

## 2015-04-07 NOTE — Assessment & Plan Note (Signed)
Erosion on EGD.Marland Kitchen. Pt on PPI and H2B. Colonoscopy neg.  Will prescribe dicyclomine as Dr. Leone PayorGessner was thinking this may be from spasming.  Pt will have trial then will call GI if continued pain.

## 2015-04-07 NOTE — Progress Notes (Signed)
   Subjective:    Patient ID: Bailey Andrews, female    DOB: 10/08/1975, 39 y.o.   MRN: [id number]  HPI  39 year old female  With history of hypothyroid, GERD, fatty liver presents with chronic onset  pain in epigastrum, referred to upper back... Yesterday severe episode lasting 2 hours. Pain was ore intense than it has been in past. Took gas x. She still has mild pain in central abd and in mid back off and on, much better than yesterday.  Still with some nausea. No emesis  Using zantac and protonix.   Son has been ill with GI virus.  She has been having diarrhea since in last 2 days. Few loose stools a day.  No fever.  10/2014 Right upper quadrant ultrasound showed small amount of sludge without gallbladder wall thickening. Normal common bile duct. H. pylori negative. CMET and hgb normal  She last saw GI Dr. Leone PayorGessner on 01/20/2015 for GERD, upper abdominal pain, and int hemmorrhoids.  colonoscopy 02/2015:  hemorrhoids upper endoscopy: erosion in stomach, stomach polyps ( path showed no h  pylori, did show scattered evidence of inflammation)  Review of Systems  Constitutional: Negative for fever and fatigue.  HENT: Negative for ear pain.   Eyes: Negative for pain.  Respiratory: Negative for chest tightness and shortness of breath.   Cardiovascular: Negative for chest pain, palpitations and leg swelling.  Gastrointestinal: Positive for abdominal pain.  Genitourinary: Negative for dysuria.       Objective:   Physical Exam  Constitutional: Vital signs are normal. She appears well-developed and well-nourished. She is cooperative.  Non-toxic appearance. She does not appear ill. No distress.  HENT:  Head: Normocephalic.  Right Ear: Hearing, tympanic membrane, external ear and ear canal normal. Tympanic membrane is not erythematous, not retracted and not bulging.  Left Ear: Hearing, tympanic membrane, external ear and ear canal normal. Tympanic membrane is not erythematous, not  retracted and not bulging.  Nose: No mucosal edema or rhinorrhea. Right sinus exhibits no maxillary sinus tenderness and no frontal sinus tenderness. Left sinus exhibits no maxillary sinus tenderness and no frontal sinus tenderness.  Mouth/Throat: Uvula is midline, oropharynx is clear and moist and mucous membranes are normal.  Eyes: Conjunctivae, EOM and lids are normal. Pupils are equal, round, and reactive to light. Lids are everted and swept, no foreign bodies found.  Neck: Trachea normal and normal range of motion. Neck supple. Carotid bruit is not present. No thyroid mass and no thyromegaly present.  Cardiovascular: Normal rate, regular rhythm, S1 normal, S2 normal, normal heart sounds, intact distal pulses and normal pulses.  Exam reveals no gallop and no friction rub.   No murmur heard. Pulmonary/Chest: Effort normal and breath sounds normal. No tachypnea. No respiratory distress. She has no decreased breath sounds. She has no wheezes. She has no rhonchi. She has no rales.  Abdominal: Soft. Normal appearance and bowel sounds are normal. There is tenderness in the epigastric area and periumbilical area.  Neurological: She is alert.  Skin: Skin is warm, dry and intact. No rash noted.  Psychiatric: Her speech is normal and behavior is normal. Judgment and thought content normal. Her mood appears not anxious. Cognition and memory are normal. She does not exhibit a depressed mood.          Assessment & Plan:

## 2015-04-07 NOTE — Progress Notes (Signed)
Pre visit review using our clinic review tool, if applicable. No additional management support is needed unless otherwise documented below in the visit note. 

## 2015-04-07 NOTE — Patient Instructions (Addendum)
Start dicyclomine with meals as recommended by GI.  If pain not improving call Dr. Leone PayorGessner to see if they recommend proceeding with surgical consult for gallbladder.  Push fluids.

## 2015-04-07 NOTE — Assessment & Plan Note (Signed)
If pain not improving , may need surgery referral.

## 2015-05-13 ENCOUNTER — Other Ambulatory Visit: Payer: Self-pay | Admitting: Family Medicine

## 2015-06-19 ENCOUNTER — Telehealth: Payer: Self-pay | Admitting: Family Medicine

## 2015-06-19 DIAGNOSIS — Z13220 Encounter for screening for lipoid disorders: Secondary | ICD-10-CM

## 2015-06-19 DIAGNOSIS — E039 Hypothyroidism, unspecified: Secondary | ICD-10-CM

## 2015-06-19 NOTE — Telephone Encounter (Signed)
-----   Message from Bailey Andrews sent at 06/15/2015  3:06 PM EDT ----- Regarding: Cpx labs 9/12, need orders. Thanks! :-) Please order  future cpx labs for pt's upcoming lab appt. Thanks Rodney Booze

## 2015-06-20 ENCOUNTER — Other Ambulatory Visit: Payer: BLUE CROSS/BLUE SHIELD

## 2015-06-28 ENCOUNTER — Encounter: Payer: BLUE CROSS/BLUE SHIELD | Admitting: Family Medicine

## 2015-11-15 ENCOUNTER — Telehealth: Payer: Self-pay | Admitting: *Deleted

## 2015-11-15 MED ORDER — HYDROCHLOROTHIAZIDE 25 MG PO TABS: 25.0000 mg | ORAL_TABLET | Freq: Every day | ORAL | Status: DC

## 2015-11-15 NOTE — Telephone Encounter (Signed)
Please call and schedule CPE with Dr. Bedsole. 

## 2015-11-16 NOTE — Telephone Encounter (Signed)
Spoke with pt .  She stated she has transferred to another physician and has already had her cpe i took dr Ermalene Searing off her pcp

## 2016-02-08 ENCOUNTER — Other Ambulatory Visit: Payer: Self-pay | Admitting: Obstetrics & Gynecology

## 2016-03-28 ENCOUNTER — Other Ambulatory Visit: Payer: Self-pay | Admitting: Family Medicine

## 2016-05-31 ENCOUNTER — Other Ambulatory Visit: Payer: Self-pay | Admitting: Gastroenterology

## 2016-05-31 DIAGNOSIS — K760 Fatty (change of) liver, not elsewhere classified: Secondary | ICD-10-CM

## 2016-05-31 DIAGNOSIS — R1013 Epigastric pain: Secondary | ICD-10-CM

## 2016-06-10 ENCOUNTER — Other Ambulatory Visit: Payer: Self-pay | Admitting: Family Medicine

## 2016-06-13 ENCOUNTER — Encounter
Admission: RE | Admit: 2016-06-13 | Discharge: 2016-06-13 | Disposition: A | Discharge status: Home / Self Care | Payer: BLUE CROSS/BLUE SHIELD | Source: Ambulatory Visit | Attending: Gastroenterology | Admitting: Gastroenterology

## 2016-06-13 ENCOUNTER — Ambulatory Visit
Admission: RE | Admit: 2016-06-13 | Discharge: 2016-06-13 | Disposition: A | Discharge status: Home / Self Care | Payer: BLUE CROSS/BLUE SHIELD | Source: Ambulatory Visit | Attending: Gastroenterology | Admitting: Gastroenterology

## 2016-06-13 DIAGNOSIS — R1013 Epigastric pain: Secondary | ICD-10-CM

## 2016-06-13 DIAGNOSIS — K760 Fatty (change of) liver, not elsewhere classified: Secondary | ICD-10-CM | POA: Diagnosis not present

## 2016-06-13 MED ORDER — TECHNETIUM TC 99M MEBROFENIN IV KIT
5.3600 | PACK | Freq: Once | INTRAVENOUS | Status: AC | PRN
  Administered 2016-06-13: 5.36 via INTRAVENOUS

## 2017-06-24 IMAGING — US US ABDOMEN COMPLETE
1 series · 13 of 25 positions shown · non-contrast
Comparison: Right upper quadrant abdominal ultrasound October 20, 2014

CLINICAL DATA: Four months of dyspepsia, history of fatty liver
disease.

EXAM:
ABDOMEN ULTRASOUND COMPLETE

[Series 1: us abdomen complete · 0.25mm/px · 13 of 73 slices shown]
[im 1/73]
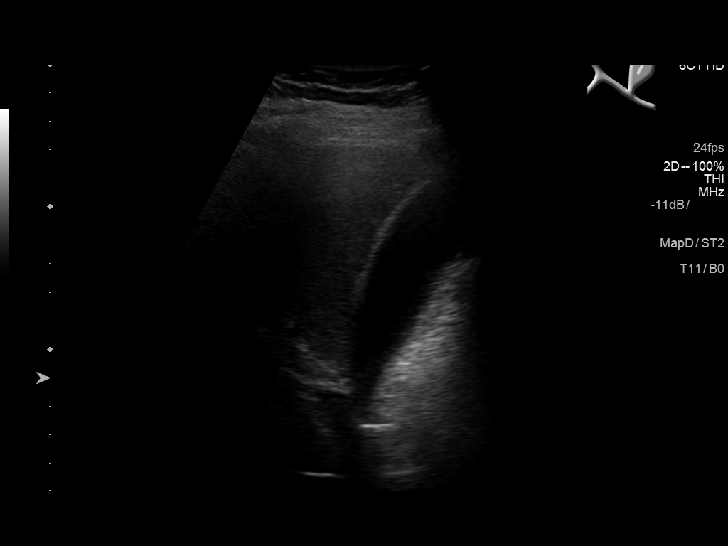
[im 7/73]
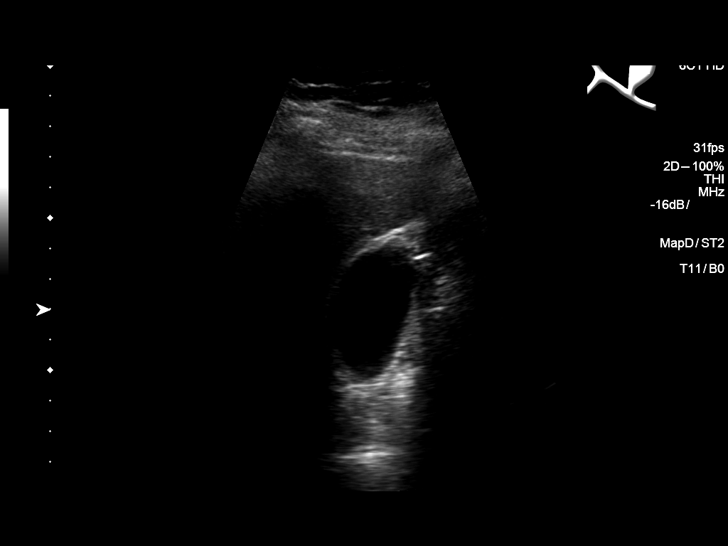
[im 13/73]
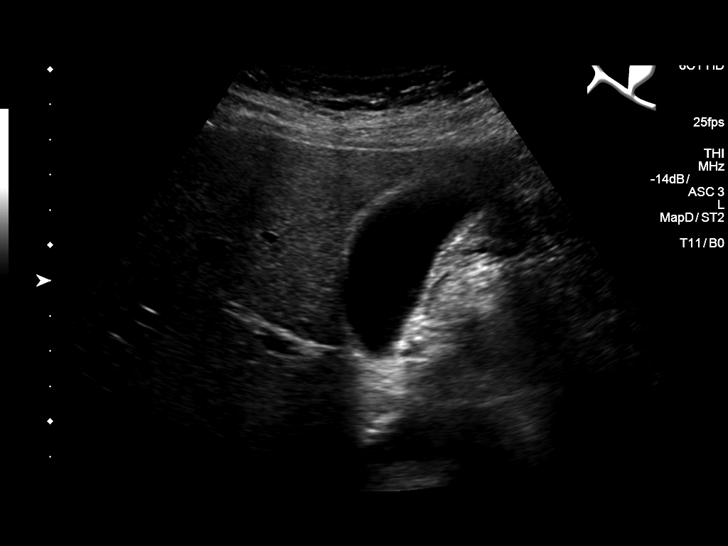
[im 19/73]
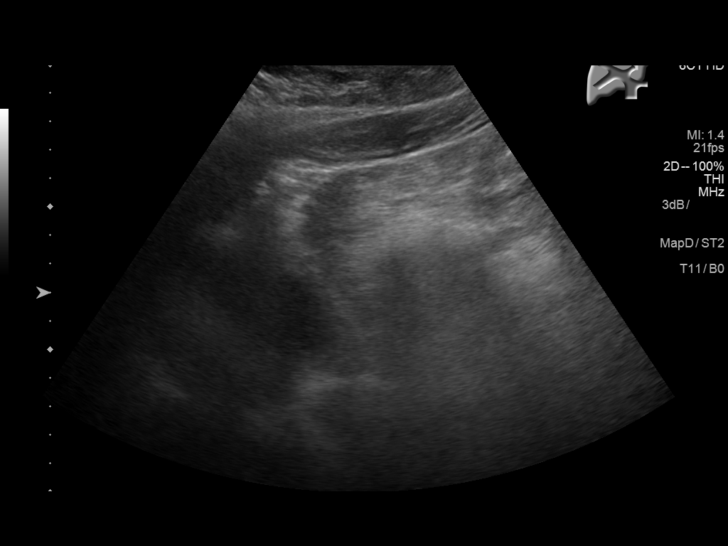
[im 25/73]
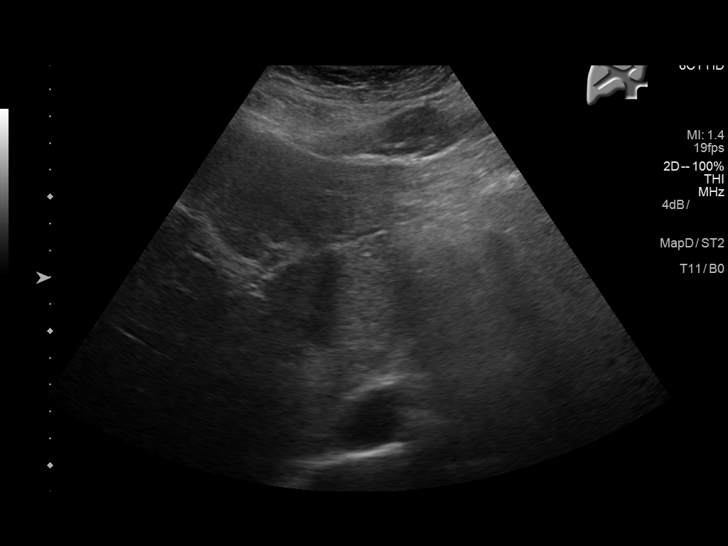
[im 31/73]
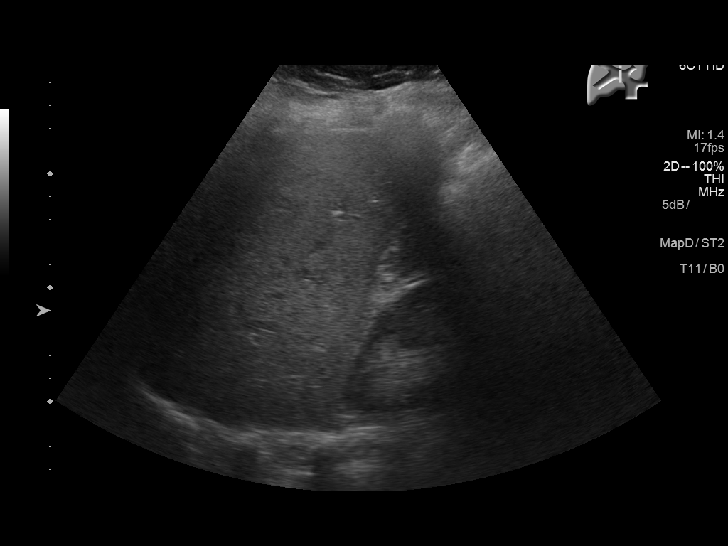
[im 37/73]
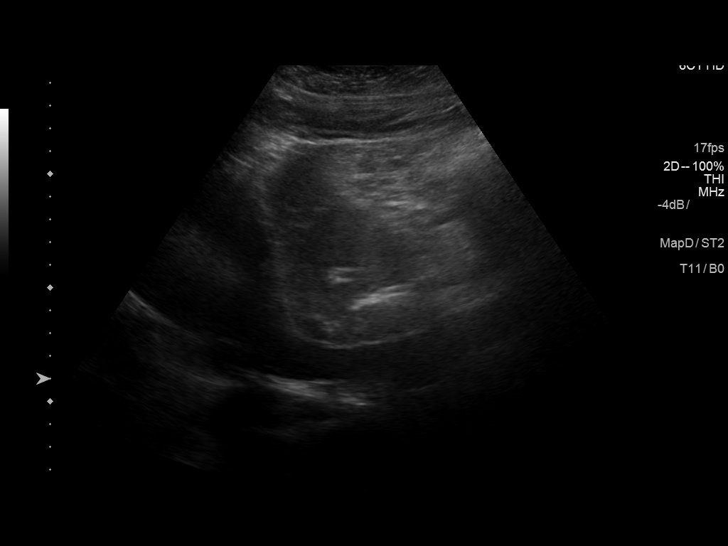
[im 43/73]
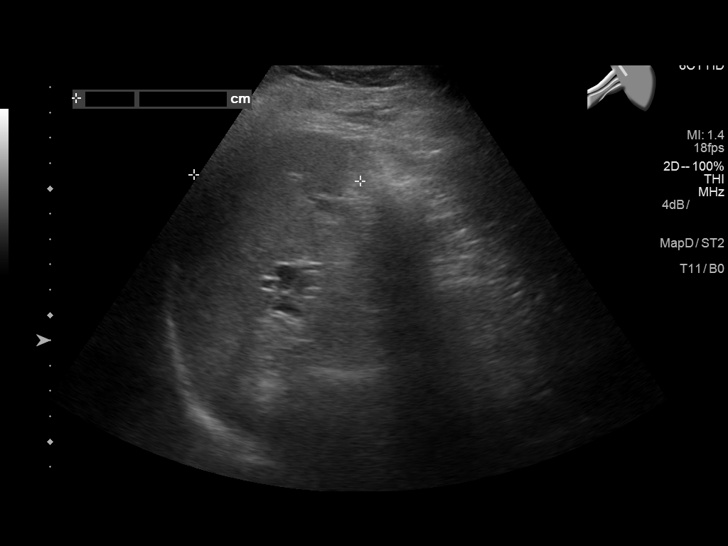
[im 49/73]
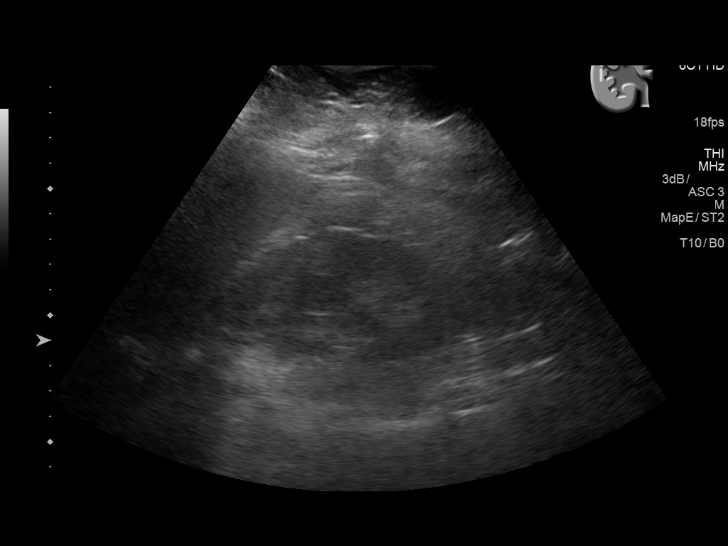
[im 55/73]
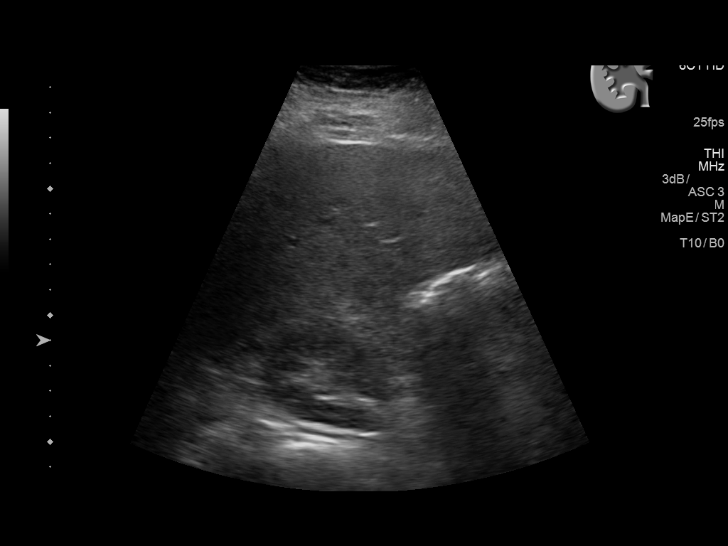
[im 61/73]
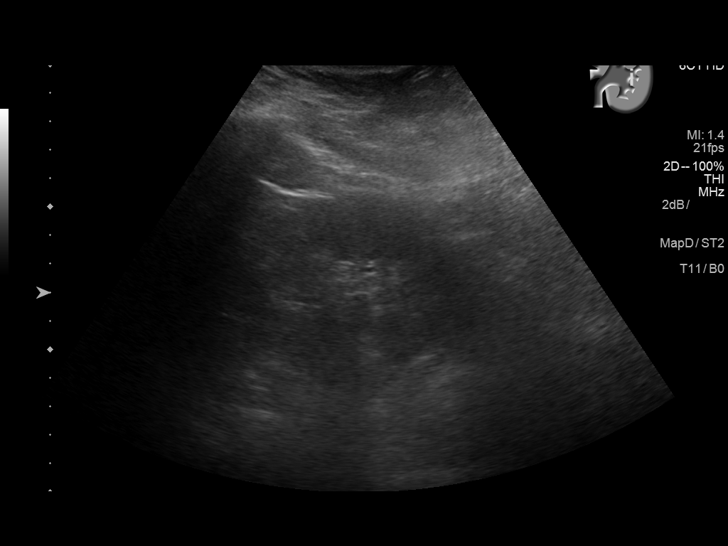
[im 67/73]
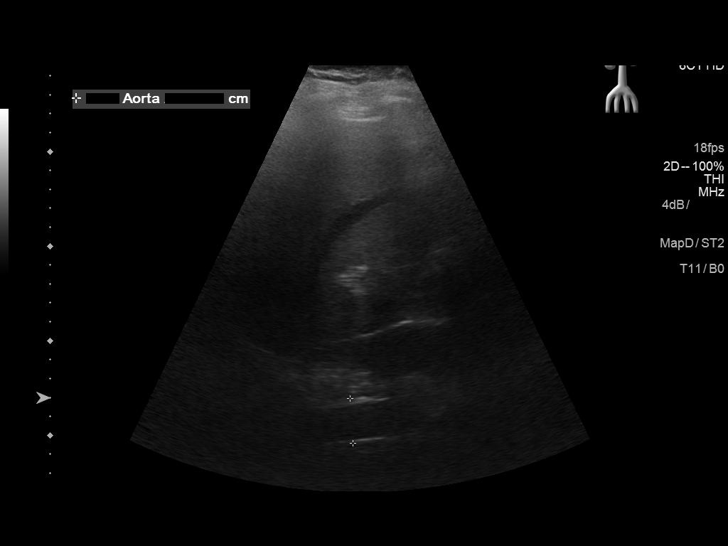
[im 73/73]
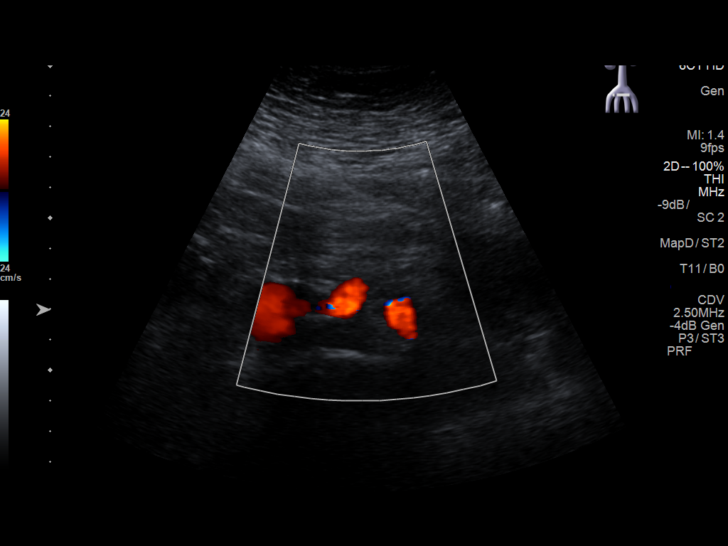

[13 of 25 positions shown; findings below may reference images not displayed]

FINDINGS: Gallbladder: The gallbladder is adequately distended. No stones or
sludge are observed today. There is no gallbladder wall thickening,
pericholecystic fluid, or positive sonographic Murphy's sign.

Common bile duct: Diameter: 4 mm

Liver: The hepatic echotexture is mildly increased similar to that
seen previously. There is no focal mass or ductal dilation.

IVC: No abnormality visualized.

Pancreas: Bowel gas obscures portions of pancreatic head and tail.
The pancreatic body appears normal.

Spleen: Size and appearance within normal limits.

Right Kidney: Length: 12.6 cm. Echogenicity within normal limits. No
mass or hydronephrosis visualized.

Left Kidney: Length: 11.2 cm. Echogenicity within normal limits. No
mass or hydronephrosis visualized.

Abdominal aorta: No aneurysm visualized.

Other findings: There is no ascites.
IMPRESSION: 1. No gallstones or sludge or other evidence of acute cholecystitis.
If chronic gallbladder dysfunction is suspected clinically, a
nuclear medicine hepatobiliary scan may be useful.
2. Fatty infiltrative change of the liver, stable. Limited
visualization of the pancreatic head and tail.
3. No abnormality observed elsewhere within the abdomen.

## 2017-10-07 IMAGING — NM NM HEPATO W/GB/PHARM/[PERSON_NAME]
3 series · 18 of 18 positions shown · non-contrast
Comparison: Non

CLINICAL DATA: Dyspepsia, fatty infiltration liver, abdominal pain
and nausea for 1 year, reflux

EXAM:
NUCLEAR MEDICINE HEPATOBILIARY IMAGING WITH GALLBLADDER EF
TECHNIQUE: Sequential images of the abdomen were obtained [DATE] minutes
following intravenous administration of radiopharmaceutical. After
oral ingestion of Ensure, gallbladder ejection fraction was
determined. At 60 min, normal ejection fraction is greater than 33%.
RADIOPHARMACEUTICALS:  5.36 mCi Bc-HHm  Choletec IV

[Series 1000: gallbladder ef dynamic · 4.80mm/px · 6 of 120 frames shown]
[frame 11/120]
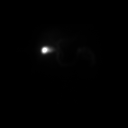
[frame 31/120]
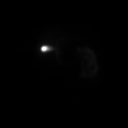
[frame 51/120]
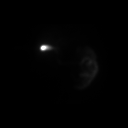
[frame 71/120]
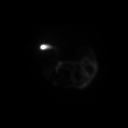
[frame 91/120]
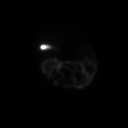
[frame 111/120]
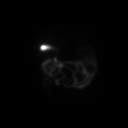

[Series 1000: gallbladder ef dynamic (results) · 4.80mm/px · 6 of 120 frames shown]
[frame 11/120]
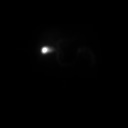
[frame 31/120]
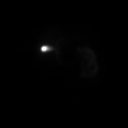
[frame 51/120]
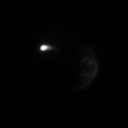
[frame 71/120]
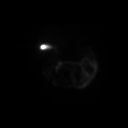
[frame 91/120]
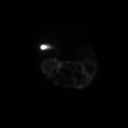
[frame 111/120]
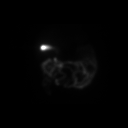

[Series 1000: hepatobiliary dynamic · 9.59mm/px · 6 of 60 frames shown]
[frame 6/60]
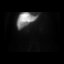
[frame 16/60]
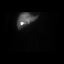
[frame 26/60]
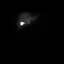
[frame 36/60]
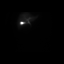
[frame 46/60]
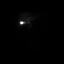
[frame 56/60]
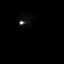

[18 of 18 positions shown; findings below may reference images not displayed]

FINDINGS: Normal tracer extraction from bloodstream indicating normal
hepatocellular function.

Normal excretion of tracer into biliary tree.

Gallbladder visualized at 16 min.

Small bowel visualized at 43 min.

No hepatic retention of tracer.

Subjectively normal emptying of tracer from gallbladder following
fatty meal stimulation.

Calculated gallbladder ejection fraction is 66%, normal.

Patient reported no symptoms following Ensure ingestion.

Normal gallbladder ejection fraction following Ensure ingestion is
greater than 33% at 1 hour.
IMPRESSION: Normal exam.

## 2023-04-05 ENCOUNTER — Telehealth: Payer: Self-pay | Admitting: Internal Medicine

## 2023-04-05 NOTE — Telephone Encounter (Signed)
Good Afternoon Dr. Leone Payor,   Patient called stating that her OBGYN had sent a referral for her to have a colonoscopy abnd her PCP has sent a referral for GERD and was wanting to make an appointment. After looking at patients chart, she was a patient of yours in 2016 and then was seen with [facility] in 2017 for GERD. Patient is wanting to transfer back to our practice to have both and EGD and colonoscopy. Patients records are in [facility], will you please review and advise on scheduling patient?  Thank you.

## 2023-04-05 NOTE — Telephone Encounter (Signed)
I will see her 

## 2023-04-10 ENCOUNTER — Encounter: Payer: Self-pay | Admitting: Internal Medicine

## 2023-04-10 NOTE — Telephone Encounter (Signed)
Patient scheduled for ov 10/8.

## 2023-07-16 ENCOUNTER — Encounter: Payer: Self-pay | Admitting: Internal Medicine

## 2023-07-16 ENCOUNTER — Ambulatory Visit: Payer: BC Managed Care – PPO | Admitting: Internal Medicine

## 2023-07-16 ENCOUNTER — Other Ambulatory Visit (INDEPENDENT_AMBULATORY_CARE_PROVIDER_SITE_OTHER): Payer: BC Managed Care – PPO

## 2023-07-16 VITALS — BP 122/80 | HR 80 | Ht 72.0 in | Wt 280.1 lb

## 2023-07-16 DIAGNOSIS — K760 Fatty (change of) liver, not elsewhere classified: Secondary | ICD-10-CM | POA: Diagnosis not present

## 2023-07-16 DIAGNOSIS — K219 Gastro-esophageal reflux disease without esophagitis: Secondary | ICD-10-CM

## 2023-07-16 DIAGNOSIS — R0989 Other specified symptoms and signs involving the circulatory and respiratory systems: Secondary | ICD-10-CM | POA: Diagnosis not present

## 2023-07-16 DIAGNOSIS — R1013 Epigastric pain: Secondary | ICD-10-CM

## 2023-07-16 DIAGNOSIS — Z1211 Encounter for screening for malignant neoplasm of colon: Secondary | ICD-10-CM

## 2023-07-16 DIAGNOSIS — K594 Anal spasm: Secondary | ICD-10-CM

## 2023-07-16 LAB — HEPATIC FUNCTION PANEL
ALT: 13 U/L (ref 0–35)
AST: 12 U/L (ref 0–37)
Albumin: 4 g/dL (ref 3.5–5.2)
Alkaline Phosphatase: 59 U/L (ref 39–117)
Bilirubin, Direct: 0.1 mg/dL (ref 0.0–0.3)
Total Bilirubin: 0.5 mg/dL (ref 0.2–1.2)
Total Protein: 6.9 g/dL (ref 6.0–8.3)

## 2023-07-16 NOTE — Progress Notes (Addendum)
Bailey Andrews 47 y.o. [birth year] [id number]  Assessment & Plan:   Encounter Diagnoses  Name Primary?   Gastroesophageal reflux disease without esophagitis Yes   Abdominal pain, epigastric    Colon cancer screening    Fatty liver    Chronic throat clearing    Anal spasm        Gastroesophageal Reflux Disease (GERD) Chronic symptoms controlled with daily Omeprazole 20mg  but ? If still needs this - she would like to attempt stopping. Epigastric pain and gas after eating at times. No dysphagia. Chronic throat clearing possibly related to reflux or behavioral/allergies (most likely) -recommendations to  taper Omeprazole provided in AVS. -Provide patient with reflux diet handout and throat clearing handout. -Keep a food diary to identify potential triggers.  Colon Cancer Screening Last colonoscopy in 2016 with no polyps. Plan screening exam 2026.  Nonalcoholic Fatty Liver Disease (NAFLD) Previous imaging showed increased fat in the liver. Discussed that diet high in carbohydrates and sugars is most common cause of this. Alcohol not an issue for her.. -Recommend reducing intake of carbohydrates and sugars, particularly from liquids (sweet tea). Low carb eating handout provided (Dr. Jolinda Croak) -Encouraged weight loss. - metabolic health risks of DM and CAD discussed  Orders Placed This Encounter  Procedures   Hepatic function panel   Lab Results  Component Value Date   ALT 13 07/16/2023   AST 12 07/16/2023   ALKPHOS 59 07/16/2023   BILITOT 0.5 07/16/2023   Fib 4 calculation number of 0.89 points advanced fibrosis excluded regarding fatty liver disease no additional imaging studies needed right now.   Intermittent rectal spasms for the past six months. No associated bleeding or change in bowel habits. She declined rectal exam today -Consider further evaluation if spasms become persistent or daily, or if other symptoms develop.        Subjective:  Discussed the use of  AI scribe software for clinical note transcription with the patient, who gave verbal consent to proceed.  Chief Complaint: Reflux questions, question schedule colonoscopy  HPI 47 year old white woman with a history of hypothyroidism hypertension hyperlipidemia fatty liver and anal fissure, presenting for evaluation. The patient has been seen at [facility] clinic in 2017 on 2 occasions for heartburn and dyspeptic symptoms and was noted to have a normal HIDA scan with ejection fraction and ultrasound consistent with fatty liver on more than 1 occasion 2016 and 2017.  She has also been seen by me in 2016, had a colonoscopy and upper endoscopy at that time, which showed hemorrhoids and benign fundic gland polyps respectively. She was referred today by her gynecologist for a colonoscopy, and her PCP suggested she have an office visit due to upper GI symptoms also.  The patient reports ongoing heartburn and indigestion, which is controlled with daily over-the-counter Prilosec (omeprazole) 20mg . She experiences discomfort if she skips the medication for a couple of days. She also reports epigastric pain, particularly after eating, and excessive gas. The pain does not interfere significantly with her daily life, but it does cause discomfort and reduces her appetite. She has not identified any specific food triggers.  The patient also reports occasional mildly painful spasms in the rectum, which can be intense but are not daily and do not interfere with her activities. This has been an issue for approximately six months. She denies any recent rectal bleeding.     Diet review - sweet tea 2-3 +/day, no alcohol   Recent labs at PCP - viewed through herportal -  May 2024  CBC NL PLT 172 Lipids NL (on Crestor) Glucose 109 TSH 1.18 A1 c 5.8% Mar 01, 2015 Colonoscopy internal/external hemorrhoids otherwise normal EGD with single erosion on the prepyloric fold and fundic gland polyps  Allergies  Allergen  Reactions   Penicillins     hives   Current Meds  Medication Sig   fluocinonide (LIDEX) 0.05 % external solution Apply 1 Application topically as needed.   hydrochlorothiazide (HYDRODIURIL) 25 MG tablet Take 1 tablet (25 mg total) by mouth daily. NEEDS OFFICE VISIT   levocetirizine (XYZAL) 5 MG tablet Take 1 tablet by mouth as needed.   levothyroxine (SYNTHROID) 125 MCG tablet Take 1 tablet by mouth daily.   omeprazole (PRILOSEC OTC) 20 MG tablet Take 20 mg by mouth daily.   rosuvastatin (CRESTOR) 10 MG tablet Take 10 mg by mouth daily.   Past Medical History:  Diagnosis Date   Anal fissure    Fatty liver    Hyperlipidemia    Hypertension    Thyroid disease    Past Surgical History:  Procedure Laterality Date   CESAREAN SECTION     COLONOSCOPY  2016   DILATION AND CURETTAGE OF UTERUS  10/08/2004   ESOPHAGOGASTRODUODENOSCOPY  2016   VENOUS ABLATION Bilateral 10/08/2009   Social History   Social History Narrative   Married, 2 sons   Works in Audiological scientist   no EtOH   2-3 caffeine/day   No drugs tobacco   family history includes Alcohol abuse in her father; Arthritis in her father; Colon polyps in her paternal grandmother; Diabetes in her brother, father, and mother; Hearing loss in her maternal grandfather and paternal grandfather; Heart disease (age of onset: 48) in her father; Hyperlipidemia in her brother, father, and mother; Hypertension in her brother, father, mother, and son; Lymphoma in her maternal aunt.  ROS: as per HPI o/w negative  Objective:   Physical Exam BP 122/80 (BP Location: Left Arm, Patient Position: Sitting, Cuff Size: Large)   Pulse 80   Ht 6' (1.829 m) Comment: height measured without shoes  Wt 280 lb 2 oz (127.1 kg)   LMP 07/14/2023   BMI 37.99 kg/m   Physical Exam   CHEST: Lungs clear to auscultation. CARDIOVASCULAR: Heart sounds normal with no rubs, murmurs, or gallops. ABDOMEN: Normal bowel sounds, liver and spleen not enlarged, slight  tenderness. EXTREMITIES: No ankle edema.     Rectal exam declined

## 2023-07-16 NOTE — Patient Instructions (Addendum)
While PPI's (e.g omeprazole) are one way to treat reflux and esophagitis (inflammation of the esophagus), there are lifestyle modifications that can be really useful in managing symptoms, independent of PPI's and their adverse side effects. These lifestyle practices include:  Eat five to seven small meals to avoid overfilling your stomach. Exercise regularly to promote peristalsis and helathy digestion. Eat before sunset or shortly thereafter - your stomach literally goes to sleeps when the sun sets. Eat your largest meal at breakfast and your smallest meal at dinner. Chew your food well. Split high-fiber foods up throughout the day to avoid getting too full. Avoid fatty foods, fried foods, dairy, caffeine, chocolate, carbonated beverages, and nicotine - they can all slow down the emptying of your stomach, and/or cause the valve between your esophagus and stomach to open inappropriately, leading to reflux symptoms. Be mindful of chewing gum, spicy foods, citrus, mint, onions, garlic, alcohol, and even decaffeinated teas, as these choices may exacerbate reflux symptoms. Drink between meals instead of with meals and taking frequent, small sips so you don't get too full. Finish eating four hours before bedtime/lying down. Move after eating. Take a walk around the block to get your stomach moving, but wait a few hours after eating before partaking in vigorous exercise. Wear clothes that are not tight around your waist and abdomen. Reduce stress through daily exercise, yoga, meditation, mindful breathing, etc. For those who are currently using PPI's and would like to taper off (by implementing the above suggestions), keep in mind that these drugs are potent acid suppressors and when you stop taking them, there's frequently a major surge of acid that can result in significant worsening of symptoms. You will eventually get better as acid levels drop to more normal levels, but many people don't make it  through this period and end up back on their PPI's. Here's what I recommend to taper off:  Every other day for 2-3 weeks then every 3rd day for a while and try to quit. Along the way ok to use antacids 9Tums, Maalox, etc) and Pepcid 20 mg OT as needed.    You are a chronic throat clearer! You are not alone! The causes of chronic throat clearing include acid reflux (laryngopharyngeal reflux), allergies, environmental irritants such as tobacco smoke and air pollution, and asthma. If present for a long time throat clearing can become habit forming. When you clear your throat, you are transferring mucus from your throat up into your mouth and nose. We all secrete up to 2 liters (imagine a big Coke bottle) of mucus a day. This saliva is usually swallowed and ends up in the toilet eventually. By clearing the mucus back into your mouth and nose you are sending the saliva in the wrong direction. This is counterproductive. Unless you are walking around spitting all day (which most throat clearers do not do), the mucus will work its way back down to the throat and eventually be swallowed. Get the mucus going in the right direction. Swallow! Swallow! Swallow! No throat clearing.  Chronic throat clearing is damaging. The trauma from the throat clearing can cause redness and swelling of your vocal cords. If the clearing is very excessive small growths (granulomas) can form. These granulomas can get so large that they can eventually affect your breathing. Surgical removal may be necessary. The irritation and swelling produced by the clearing can cause saliva to sit in your throat. This causes more throat clearing. More throat clearing causes more stagnant mucus which causes  more throat clearing, which causes more mucus, etc... A vicious cycle will ensue and the habit can be very difficult to break. Without your help and a conscious effort on your part to break the cycle, the throat clearing will never stop.  Your doctor  may prescribe medication and behavioral modifications to treat acid reflux disease. Nose and throat sprays may be prescribed to treat underlying allergies or asthma. Avoiding possible irritants will be recommended. Without changes to your behavior these treatments will not be successful. The following alterations are recommended:  Do not clear your throat. Swallow instead. This gets the mucus going in the right direction towards the toilet. Carry around some water to assist with swallowing and mucus clearance. When you feel the urge to clear your throat take a sip of the water. If you absolutely need to clear your throat perform a non-traumatic throat clear. To do this pant with your mouth open and say "[place], [place], [place]" with a powerful but very breathy voice. This will clear the secretions without causing damage. Increase your water intake. This will thin secretions and make it easier to swallow. Comply with the behavior recommendations for reflux disease. Chew baking soda ([employer]) gum. This can be found on the internet or in the tooth paste isle of your pharmacy. Gum chewing can help with swallowing, reflux, and throat clearing. Chew three pieces a day. If you develop jaw discomfort or headaches decrease the amount of gum chewing. Tell your friends and family to tell you to swallow when you clear your throat. Some people have been clearing so long that they don't even know when they are doing it. Be patient. The urge to clear your throat will not go away overnight. It may take 8 or 12 weeks for the medication and behavior modifications to work.  Your provider has requested that you go to the basement level for lab work before leaving today. Press "B" on the elevator. The lab is located at the first door on the left as you exit the elevator.  Due to recent changes in healthcare laws, you may see the results of your imaging and laboratory studies on MyChart before your provider has had a chance to  review them.  We understand that in some cases there may be results that are confusing or concerning to you. Not all laboratory results come back in the same time frame and the provider may be waiting for multiple results in order to interpret others.  Please give [place] 48 hours in order for your provider to thoroughly review all the results before contacting the office for clarification of your results.   Reduce carbohydrates as we discussed.  Please keep a food diary to see if we can figure out the trigger for your epigastric pain.  Daisey Must!    I appreciate the opportunity to care for you. Stan Head, MD, [facility]

## 2024-04-17 ENCOUNTER — Ambulatory Visit (INDEPENDENT_AMBULATORY_CARE_PROVIDER_SITE_OTHER): Payer: Self-pay | Admitting: Vascular Surgery

## 2024-04-17 VITALS — BP 121/83 | HR 85 | Ht 72.0 in | Wt 277.0 lb

## 2024-04-17 DIAGNOSIS — E785 Hyperlipidemia, unspecified: Secondary | ICD-10-CM | POA: Insufficient documentation

## 2024-04-17 DIAGNOSIS — I872 Venous insufficiency (chronic) (peripheral): Secondary | ICD-10-CM

## 2024-04-17 DIAGNOSIS — I10 Essential (primary) hypertension: Secondary | ICD-10-CM | POA: Insufficient documentation

## 2024-04-17 NOTE — Progress Notes (Signed)
 Patient ID: Bailey Andrews, female   DOB: July 30, 1976, 48 y.o.   MRN: [id number]  Chief Complaint  Patient presents with   np. consult. varicose veins in legs - unspecified wheather     HPI Bailey Andrews is a 48 y.o. female.  I am asked to see the patient by DR. Jadali for evaluation of varicose veins of the lower extremity.  She has had these for many years but had previous treatment for venous disease about 15 to 20 years ago.  She has noticed increasing prominence of the varicosities as well as discoloration of the lower leg.  This is associated with swelling.  She has been wearing her compression socks with some relief of the pain and swelling.  She denies any open wounds or infection.  Both lower extremities are affected, but the right leg may be a little bit worse.  She does frequently elevate her legs and exercises to try to help the legs feel better.  No previous history of DVT or superficial thrombophlebitis to her knowledge.     Past Medical History:  Diagnosis Date   Anal fissure    Fatty liver    Hyperlipidemia    Hypertension    Thyroid disease     Past Surgical History:  Procedure Laterality Date   CESAREAN SECTION     COLONOSCOPY  2016   DILATION AND CURETTAGE OF UTERUS  10/08/2004   ESOPHAGOGASTRODUODENOSCOPY  2016   VENOUS ABLATION Bilateral 10/08/2009     Family History  Problem Relation Age of Onset   Diabetes Mother    Hyperlipidemia Mother    Hypertension Mother    Hyperlipidemia Father    Hypertension Father    Diabetes Father    Alcohol abuse Father    Arthritis Father    Heart disease Father 56       CABG   Diabetes Brother    Hyperlipidemia Brother    Hypertension Brother    Hearing loss Maternal Grandfather    Colon polyps Paternal Grandmother    Hearing loss Paternal Grandfather    Lymphoma Maternal Aunt    Hypertension Son    Colon cancer Neg Hx    Gallbladder disease Neg Hx    Kidney disease Neg Hx       Social History    Tobacco Use   Smoking status: Never   Smokeless tobacco: Never  Vaping Use   Vaping status: Never Used  Substance Use Topics   Alcohol use: No   Drug use: No     Allergies  Allergen Reactions   Penicillins     hives    Current Outpatient Medications  Medication Sig Dispense Refill   fluocinonide (LIDEX) 0.05 % external solution Apply 1 Application topically as needed.     hydrochlorothiazide  (HYDRODIURIL ) 25 MG tablet Take 1 tablet (25 mg total) by mouth daily. NEEDS OFFICE VISIT 90 tablet 0   levocetirizine (XYZAL) 5 MG tablet Take 1 tablet by mouth as needed.     levothyroxine  (SYNTHROID ) 125 MCG tablet Take 1 tablet by mouth daily.     omeprazole (PRILOSEC OTC) 20 MG tablet Take 20 mg by mouth daily.     rosuvastatin (CRESTOR) 10 MG tablet Take 10 mg by mouth daily.     No current facility-administered medications for this visit.      REVIEW OF SYSTEMS (Negative unless checked)  Constitutional: [] Weight loss  [] Fever  [] Chills Cardiac: [] Chest pain   [] Chest pressure   [] Palpitations   []   Shortness of breath when laying flat   [] Shortness of breath at rest   [] Shortness of breath with exertion. Vascular:  [] Pain in legs with walking   [] Pain in legs at rest   [] Pain in legs when laying flat   [] Claudication   [] Pain in feet when walking  [] Pain in feet at rest  [] Pain in feet when laying flat   [] History of DVT   [] Phlebitis   [x] Swelling in legs   [x] Varicose veins   [] Non-healing ulcers Pulmonary:   [] Uses home oxygen   [] Productive cough   [] Hemoptysis   [] Wheeze  [] COPD   [] Asthma Neurologic:  [] Dizziness  [] Blackouts   [] Seizures   [] History of stroke   [] History of TIA  [] Aphasia   [] Temporary blindness   [] Dysphagia   [] Weakness or numbness in arms   [] Weakness or numbness in legs Musculoskeletal:  [] Arthritis   [] Joint swelling   [] Joint pain   [] Low back pain Hematologic:  [] Easy bruising  [] Easy bleeding   [] Hypercoagulable state   [] Anemic   [] Hepatitis Gastrointestinal:  [] Blood in stool   [] Vomiting blood  [x] Gastroesophageal reflux/heartburn   [] Abdominal pain Genitourinary:  [] Chronic kidney disease   [] Difficult urination  [] Frequent urination  [] Burning with urination   [] Hematuria Skin:  [] Rashes   [] Ulcers   [] Wounds Psychological:  [] History of anxiety   []  History of major depression.    Physical Exam BP 121/83   Pulse 85   Ht 6' (1.829 m)   Wt 277 lb (125.6 kg)   BMI 37.57 kg/m  Gen:  WD/WN, NAD Head: /AT, No temporalis wasting.  Ear/Nose/Throat: Hearing grossly intact, nares w/o erythema or drainage, oropharynx w/o Erythema/Exudate Eyes: Conjunctiva clear, sclera non-icteric  Neck: trachea midline.  No JVD.  Pulmonary:  Good air movement, respirations not labored, no use of accessory muscles  Cardiac: RRR, no JVD Vascular:  Vessel Right Left  Radial Palpable Palpable                          PT 1+ 1+  DP 2+ 2+   Gastrointestinal:. No masses, surgical incisions, or scars. Musculoskeletal: M/S 5/5 throughout.  Extremities without ischemic changes.  No deformity or atrophy.  Fairly diffuse varicosities present bilaterally with the largest measuring 1 to 2 mm in diameter.  Stasis dermatitis changes are present a little worse on the right than the left.  Trace to 1+ bilateral lower extremity edema. Neurologic: Sensation grossly intact in extremities.  Symmetrical.  Speech is fluent. Motor exam as listed above. Psychiatric: Judgment intact, Mood & affect appropriate for pt's clinical situation. Dermatologic: No rashes or ulcers noted.  No cellulitis or open wounds.    Radiology No results found.  Labs No results found for this or any previous visit (from the past 2160 hours).  Assessment/Plan:  Hypertension blood pressure control important in reducing the progression of atherosclerotic disease. On appropriate oral medications.   Chronic venous insufficiency The patient has CEAP class III  venous insufficiency.  She has had previous venous treatments many years ago.  I have recommended she wear compression socks on a daily basis and a prescription was given today.  She does wear them intermittently now.  I have recommended she continue with elevation and exercise.  I recommended a venous reflux study to be done in the near future at her convenience.  Discussed the pathophysiology and natural history of venous disease with the patient today.  She voices her understanding  and we will see her back with her venous reflux study in the near future.  Hyperlipidemia lipid control important in reducing the progression of atherosclerotic disease. Continue statin therapy      Selinda Gu 04/17/2024, 10:59 AM   This note was created with Dragon medical transcription system.  Any errors from dictation are unintentional.

## 2024-04-17 NOTE — Assessment & Plan Note (Signed)
 blood pressure control important in reducing the progression of atherosclerotic disease. On appropriate oral medications.

## 2024-04-17 NOTE — Assessment & Plan Note (Signed)
 lipid control important in reducing the progression of atherosclerotic disease. Continue statin therapy

## 2024-04-17 NOTE — Patient Instructions (Signed)
Chronic Venous Insufficiency Chronic venous insufficiency is a condition that causes the veins in the legs to struggle to pump blood from the legs to the heart. It is also called venous stasis. This condition can happen when the vein walls are stretched, weakened, or damaged. It can also happen when the valves inside the vein are damaged. With the right treatment, you should be able to still lead an active life. What are the causes? Common causes of this condition include: Venous hypertension. This is high blood pressure inside the veins. Sitting or standing too long. This can cause increased blood pressure in the veins of the leg. Deep vein thrombosis (DVT). This is a blood clot that blocks blood flow in a vein. Phlebitis. This is inflammation of a vein. It can cause a blood clot to form. An abnormal growth of cells (tumor) in the area between your hip bones (pelvis). This can cause blood to back up. What increases the risk? Factors that may make you more likely to get this condition include: Having a family history of the condition. Being overweight. Being pregnant. Not getting enough exercise. Smoking. Having a job that requires you to sit or stand in one place for a long time. Being a certain age. Females in their 23s and 5s and males in their 20s are more likely to get this condition. What are the signs or symptoms? Symptoms of this condition include: Varicose veins. These are veins that are enlarged, bulging, or twisted. Skin breakdown or ulcers. Reddened skin or dark discoloration of the skin on the leg between the knee and ankle. Lipodermatosclerosis. This is brown, smooth, tight, and painful skin just above the ankle. It is often on the inside of the leg. Swelling of the legs. How is this diagnosed? This condition may be diagnosed based on your medical history and a physical exam. You may also need tests, such as: A duplex ultrasound. This shows how blood flows through a blood  vessel. Plethysmography. This tests blood flow. Venogram. This looks at the veins using an X-ray and dye. How is this treated? The goals of treatment are to help you return to an active life and to relieve pain. Treatment may include: Wearing compression stockings. These do not cure the condition but can help relieve symptoms. They can also help stop your condition from getting worse. Sclerotherapy. This involves injecting a solution to shrink damaged veins. Surgery. This may include: Vein stripping. This is when a diseased vein is taken out. Laser ablation surgery. This is when blood flow is cut off through the vein. Repairing or remaking a valve inside the affected vein. Follow these instructions at home: Lifestyle Do not use any products that contain nicotine or tobacco. These products include cigarettes, chewing tobacco, and vaping devices, such as e-cigarettes. If you need help quitting, ask your health care provider. Stay active. Exercise, walk, or do other activities. Ask your provider what activities are safe for you. General instructions Take over-the-counter and prescription medicines only as told by your provider. Drink enough fluid to keep your pee (urine) pale yellow. Wear compression stockings as told by your provider. These stockings help to prevent blood clots and reduce swelling in your legs. Keep all follow-up visits. Your provider will check your legs for any changes and adjust your treatment plan as needed. Contact a health care provider if: You have redness, swelling, or more pain in the affected area. You see a red streak or line that goes up or down from the  area. You have skin breakdown or skin loss. You get an injury in the affected area. You get a fever. Get help right away if: You have severe pain that does not get better with medicine. You get an injury and an open wound in the affected area. Your foot or ankle becomes numb or weak all of a sudden. You have  trouble moving your foot or ankle. Your symptoms do not go away or get worse. You have chest pain. You have shortness of breath. These symptoms may be an emergency. Get help right away. Call 911. Do not wait to see if the symptoms will go away. Do not drive yourself to the hospital. This information is not intended to replace advice given to you by your health care provider. Make sure you discuss any questions you have with your health care provider. Document Revised: 10/09/2022 Document Reviewed: 10/09/2022 Elsevier Patient Education  2024 [employer].

## 2024-04-17 NOTE — Assessment & Plan Note (Signed)
 The patient has CEAP class III venous insufficiency.  She has had previous venous treatments many years ago.  I have recommended she wear compression socks on a daily basis and a prescription was given today.  She does wear them intermittently now.  I have recommended she continue with elevation and exercise.  I recommended a venous reflux study to be done in the near future at her convenience.  Discussed the pathophysiology and natural history of venous disease with the patient today.  She voices her understanding and we will see her back with her venous reflux study in the near future.

## 2024-05-11 ENCOUNTER — Encounter: Payer: Self-pay | Admitting: Internal Medicine

## 2024-05-15 ENCOUNTER — Other Ambulatory Visit (INDEPENDENT_AMBULATORY_CARE_PROVIDER_SITE_OTHER)

## 2024-05-15 ENCOUNTER — Ambulatory Visit: Payer: Self-pay | Admitting: Family

## 2024-05-15 ENCOUNTER — Encounter (INDEPENDENT_AMBULATORY_CARE_PROVIDER_SITE_OTHER): Payer: Self-pay | Admitting: Vascular Surgery

## 2024-05-15 ENCOUNTER — Ambulatory Visit (INDEPENDENT_AMBULATORY_CARE_PROVIDER_SITE_OTHER): Admitting: Vascular Surgery

## 2024-05-15 VITALS — BP 131/75 | HR 73 | Resp 18 | Ht 72.0 in | Wt 275.6 lb

## 2024-05-15 DIAGNOSIS — I10 Essential (primary) hypertension: Secondary | ICD-10-CM | POA: Diagnosis not present

## 2024-05-15 DIAGNOSIS — I872 Venous insufficiency (chronic) (peripheral): Secondary | ICD-10-CM | POA: Diagnosis not present

## 2024-05-15 DIAGNOSIS — E785 Hyperlipidemia, unspecified: Secondary | ICD-10-CM | POA: Diagnosis not present

## 2024-05-15 NOTE — Progress Notes (Signed)
 MRN : [id number]  Bailey Andrews is a 48 y.o. ([birth year]) female who presents with chief complaint of  Chief Complaint  Patient presents with   Follow-up    Pt conv bilateral reflux  .  History of Present Illness: Patient returns today in follow up of her venous disease. No major changes since her initial visit about a month ago.  She is wearing her compression socks and elevating her legs.  She is trying to exercise.  Her venous reflux study showed 1 area of reflux at the saphenofemoral junction on the right and 1 area of reflux in the left proximal thigh but was otherwise normal.  Current Outpatient Medications  Medication Sig Dispense Refill   fluocinonide (LIDEX) 0.05 % external solution Apply 1 Application topically as needed.     hydrochlorothiazide  (HYDRODIURIL ) 25 MG tablet Take 1 tablet (25 mg total) by mouth daily. NEEDS OFFICE VISIT 90 tablet 0   levocetirizine (XYZAL) 5 MG tablet Take 1 tablet by mouth as needed.     levothyroxine  (SYNTHROID ) 125 MCG tablet Take 1 tablet by mouth daily.     omeprazole (PRILOSEC OTC) 20 MG tablet Take 20 mg by mouth daily.     rosuvastatin (CRESTOR) 10 MG tablet Take 10 mg by mouth daily.     No current facility-administered medications for this visit.    Past Medical History:  Diagnosis Date   Anal fissure    Fatty liver    Hyperlipidemia    Hypertension    Thyroid disease     Past Surgical History:  Procedure Laterality Date   CESAREAN SECTION     COLONOSCOPY  2016   DILATION AND CURETTAGE OF UTERUS  10/08/2004   ESOPHAGOGASTRODUODENOSCOPY  2016   VENOUS ABLATION Bilateral 10/08/2009     Social History   Tobacco Use   Smoking status: Never   Smokeless tobacco: Never  Vaping Use   Vaping status: Never Used  Substance Use Topics   Alcohol use: No   Drug use: No      Family History  Problem Relation Age of Onset   Diabetes Mother    Hyperlipidemia Mother    Hypertension Mother    Hyperlipidemia Father     Hypertension Father    Diabetes Father    Alcohol abuse Father    Arthritis Father    Heart disease Father 38       CABG   Diabetes Brother    Hyperlipidemia Brother    Hypertension Brother    Hearing loss Maternal Grandfather    Colon polyps Paternal Grandmother    Hearing loss Paternal Grandfather    Lymphoma Maternal Aunt    Hypertension Son    Colon cancer Neg Hx    Gallbladder disease Neg Hx    Kidney disease Neg Hx      Allergies  Allergen Reactions   Penicillins     hives     REVIEW OF SYSTEMS (Negative unless checked)  Constitutional: [] Weight loss  [] Fever  [] Chills Cardiac: [] Chest pain   [] Chest pressure   [] Palpitations   [] Shortness of breath when laying flat   [] Shortness of breath at rest   [] Shortness of breath with exertion. Vascular:  [] Pain in legs with walking   [] Pain in legs at rest   [] Pain in legs when laying flat   [] Claudication   [] Pain in feet when walking  [] Pain in feet at rest  [] Pain in feet when laying flat   [] History of DVT   []   Phlebitis   [x] Swelling in legs   [x] Varicose veins   [] Non-healing ulcers Pulmonary:   [] Uses home oxygen   [] Productive cough   [] Hemoptysis   [] Wheeze  [] COPD   [] Asthma Neurologic:  [] Dizziness  [] Blackouts   [] Seizures   [] History of stroke   [] History of TIA  [] Aphasia   [] Temporary blindness   [] Dysphagia   [] Weakness or numbness in arms   [] Weakness or numbness in legs Musculoskeletal:  [] Arthritis   [] Joint swelling   [] Joint pain   [] Low back pain Hematologic:  [] Easy bruising  [] Easy bleeding   [] Hypercoagulable state   [] Anemic   Gastrointestinal:  [] Blood in stool   [] Vomiting blood  [x] Gastroesophageal reflux/heartburn   [] Abdominal pain Genitourinary:  [] Chronic kidney disease   [] Difficult urination  [] Frequent urination  [] Burning with urination   [] Hematuria Skin:  [] Rashes   [] Ulcers   [] Wounds Psychological:  [] History of anxiety   []  History of major depression.  Physical Examination  BP 131/75    Pulse 73   Resp 18   Ht 6' (1.829 m)   Wt 275 lb 9.6 oz (125 kg)   BMI 37.38 kg/m  Gen:  WD/WN, NAD Head: [place]/AT, No temporalis wasting. Ear/Nose/Throat: Hearing grossly intact, nares w/o erythema or drainage Eyes: Conjunctiva clear. Sclera non-icteric Neck: Supple.  Trachea midline Pulmonary:  Good air movement, no use of accessory muscles.  Cardiac: RRR, no JVD Vascular:  Vessel Right Left  Radial Palpable Palpable           Musculoskeletal: M/S 5/5 throughout.  No deformity or atrophy. Moderate stasis dermatitis changes, mild BLE edema. Neurologic: Sensation grossly intact in extremities.  Symmetrical.  Speech is fluent.  Psychiatric: Judgment intact, Mood & affect appropriate for pt's clinical situation. Dermatologic: No rashes or ulcers noted.  No cellulitis or open wounds.      Labs No results found for this or any previous visit (from the past 2160 hours).  Radiology No results found.  Assessment/Plan  Hypertension blood pressure control important in reducing the progression of atherosclerotic disease. On appropriate oral medications.   Hyperlipidemia lipid control important in reducing the progression of atherosclerotic disease. Continue statin therapy   Venous insufficiency (chronic) (peripheral) Her venous reflux study showed 1 area of reflux at the saphenofemoral junction on the right and 1 area of reflux in the left proximal thigh but was otherwise normal. These changes are fairly mild and I think a laser ablation could be considered, but its benefit may be limited.  She is going to continue her conservative measures.  She is going to see a dermatologist for the skin changes on her leg.  I will plan to see her back in 6 months.    Selinda Gu, MD  05/15/2024 9:37 AM    This note was created with Dragon medical transcription system.  Any errors from dictation are purely unintentional

## 2024-05-15 NOTE — Assessment & Plan Note (Signed)
 lipid control important in reducing the progression of atherosclerotic disease. Continue statin therapy

## 2024-05-15 NOTE — Assessment & Plan Note (Signed)
 blood pressure control important in reducing the progression of atherosclerotic disease. On appropriate oral medications.

## 2024-05-15 NOTE — Assessment & Plan Note (Signed)
 Her venous reflux study showed 1 area of reflux at the saphenofemoral junction on the right and 1 area of reflux in the left proximal thigh but was otherwise normal. These changes are fairly mild and I think a laser ablation could be considered, but its benefit may be limited.  She is going to continue her conservative measures.  She is going to see a dermatologist for the skin changes on her leg.  I will plan to see her back in 6 months.

## 2024-09-22 ENCOUNTER — Ambulatory Visit

## 2024-09-22 VITALS — BP 110/80 | HR 75 | Temp 98.0°F | Ht 72.0 in | Wt 276.0 lb

## 2024-09-22 DIAGNOSIS — R7303 Prediabetes: Secondary | ICD-10-CM | POA: Insufficient documentation

## 2024-09-22 DIAGNOSIS — E559 Vitamin D deficiency, unspecified: Secondary | ICD-10-CM

## 2024-09-22 DIAGNOSIS — E039 Hypothyroidism, unspecified: Secondary | ICD-10-CM

## 2024-09-22 DIAGNOSIS — E041 Nontoxic single thyroid nodule: Secondary | ICD-10-CM

## 2024-09-22 DIAGNOSIS — E6609 Other obesity due to excess calories: Secondary | ICD-10-CM | POA: Insufficient documentation

## 2024-09-22 DIAGNOSIS — I872 Venous insufficiency (chronic) (peripheral): Secondary | ICD-10-CM

## 2024-09-22 DIAGNOSIS — E7849 Other hyperlipidemia: Secondary | ICD-10-CM

## 2024-09-22 DIAGNOSIS — Z113 Encounter for screening for infections with a predominantly sexual mode of transmission: Secondary | ICD-10-CM

## 2024-09-22 LAB — HEMOGLOBIN A1C: Hgb A1c MFr Bld: 5.7 % (ref 4.6–6.5)

## 2024-09-22 LAB — TSH: TSH: 2.83 u[IU]/mL (ref 0.35–5.50)

## 2024-09-22 LAB — LIPID PANEL
Cholesterol: 184 mg/dL (ref 28–200)
HDL: 48.1 mg/dL (ref >39.00)
LDL Cholesterol: 115 mg/dL — ABNORMAL HIGH (ref 10–99)
NonHDL: 135.41
Total CHOL/HDL Ratio: 4
Triglycerides: 100 mg/dL (ref 10.0–149.0)
VLDL: 20 mg/dL (ref 0.0–40.0)

## 2024-09-22 NOTE — Patient Instructions (Addendum)
 Thank you for visiting [facility] today! Here's what we talked about: - Call weight management if you do not hear from them in 2 weeks  - Ultrasound will call you - Please bring copies of your records from disc - Remember our discussion about meal prepping and healthy eating

## 2024-09-22 NOTE — Progress Notes (Signed)
 Subjective:   This visit was conducted in person. The patient gave informed consent to the use of Abridge AI technology to record the contents of the encounter as documented below.   Patient ID: Bailey Andrews, female    DOB: [birth year], 48 y.o.   MRN: [id number]   Bailey Andrews is a very pleasant 48 y.o. female who presents today as a new patient.  Past medical, surgical and family history: Reviewed and updated in chart.  Allergies: Reviewed and updated in chart.  Medications: Reviewed and updated in chart.  Social history: Reviewed and updated in chart.  Last PCP and reason for leaving: last seen in July 2025, PCP retired  Discussed the use of AI scribe software for clinical note transcription with the patient, who gave verbal consent to proceed.  History of Present Illness Bailey Andrews is a 48 year old female who presents for a new pt visit.  She has a history of hypothyroidism, managed with Synthroid  125 mcg daily on an empty stomach, at least 45 minutes before eating or taking other medications. She has a thyroid  nodule diagnosed over 20 years ago, which has not been biopsied.  She has a history of high cholesterol but discontinued rosuvastatin. Her total cholesterol was 190 mg/dL in June 2025, and she was not on medication at that time. She has a family history of high cholesterol and heart disease.  She has a history of low vitamin D , with levels checked in June 2025 showing a normal range at 39 ng/mL. She is not on supplementation.  No current urinary incontinence but past episodes related to diuretic use. She takes hydrochlorothiazide  25 mg daily.  She has a history of psoriasis and uses Lidex solution prescribed by her dermatologist [facility]). She also has a history of internal hemorrhoids and high blood pressure.  She experiences occasional abdominal pain and is scheduled to see a gastroenterologist in May 2026. She is on omeprazole 20 mg  daily.  She has a history of venous issues, including a venous ablation in 2011, and reports leg discoloration and swelling.  She denies alcohol use, smoking, and drug use. She lives with her husband and has two sons, one of whom lives at home. 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     Review of Systems  All other systems reviewed and are negative.       BP 110/80 (BP Location: Left Arm, Patient Position: Sitting, Cuff Size: Large)   Pulse 75   Temp 98 F (36.7 C) (Oral)   Ht 6' (1.829 m)   Wt 276 lb (125.2 kg)   LMP 09/22/2024   SpO2 97%   BMI 37.43 kg/m   Objective:     Physical Exam GENERAL: Alert, cooperative, well developed, no acute distress. Obese HEAD: Normocephalic atraumatic. EYES: Extraocular movements intact BL, pupils round, equal and reactive to light BL, conjunctivae normal BL. EXTREMITIES: Swelling and tenderness to palpation in lower extremities BL, Varicose veins present BL, hyperpigmentation of the shins BL. NEUROLOGICAL: Oriented to person, place and time, no gait abnormalities, moves all extremities without gross motor or sensory deficit. NECK: No thyromegaly or tenderness, no nodules palpated       Assessment & Plan:   New patient, past medical and social history thoroughly reviewed and updated in chart.  Assessment & Plan Lower extremity venous insufficiency with swelling and skin changes Chronic venous insufficiency with swelling and skin changes see  media. Likely venous stasis dermatitis vs diabetes-related skin changes, will see what A1c shows.  May improve with more aggressive treatment of venous insufficiency.  Previous vein procedures were ineffective.  - Advised wearing smaller size compression stockings for better compression. - Recommended elevating legs daily to reduce swelling. - Scheduled follow-up in 4 weeks to assess response to compression and swelling.  Acquired  hypothyroidism Well-managed on Synthroid  125 mcg daily. Recent TSH levels 40mo ago are within normal range. - Ordered repeat TSH  for monitoring. - Continue Synthroid  125 mcg daily  Thyroid  nodule Identified over 20 years ago, no biopsy performed.  Records not available at this time.  Current thyroid  function tests are normal.  Thyroid  exam is benign, however, will repeat imaging.  - Ordered thyroid  ultrasound to assess current status of thyroid  nodule.  Prediabetes A1c indicates prediabetes at 5.9 six mo ago, reversible with lifestyle changes. Emphasized importance of diet and exercise to prevent progression to diabetes.  - Referred to [facility] for dietary guidance. - Ordered repeat A1c to monitor glucose levels. - Encouraged portion control and meal prepping to improve diet.  Obesity Contributing to metabolic risk factors including prediabetes and fatty liver. Discussed importance of weight loss through diet and exercise.  - Encouraged regular exercise, including aerobic and resistance training. - Advised on dietary changes to support weight loss, including increased intake of fruits and vegetables and reduced processed foods. - Referred to weight clinic  Other hyperlipidemia Cholesterol level is 190 mg/dL with a [id number] cardiovascular risk of 1.3% six months ago, below the threshold for medication. Emphasized lifestyle modifications to manage cholesterol levels.  - Recommended diet and lifestyle modifications, including increased exercise and dietary changes. - Ordered repeat cholesterol panel   Vitamin D  deficiency Vitamin D  level is 39 ng/mL, within normal range. No supplementation needed at this time. - Will repeat vitamin D  level in 1 year.  General Health Maintenance Discussed importance of routine screenings. HIV and hepatitis C screenings are due. - Ordered HIV and hepatitis C screenings. - Will hold off on scheduling physical until  we have records to see when patient is due   Return in about 4 weeks (around 10/20/2024) for Leg lesions and swelling.   Tahjir Silveria K Quaneisha Hanisch, MD  09/22/2024    Contains text generated by [employer].

## 2024-09-23 LAB — HIV ANTIBODY (ROUTINE TESTING W REFLEX)
HIV 1&2 Ab, 4th Generation: NONREACTIVE
HIV FINAL INTERPRETATION: NEGATIVE

## 2024-09-23 LAB — HEPATITIS C ANTIBODY: Hepatitis C Ab: NONREACTIVE

## 2024-09-24 ENCOUNTER — Ambulatory Visit: Payer: Self-pay

## 2024-10-06 ENCOUNTER — Other Ambulatory Visit: Payer: Self-pay

## 2024-10-06 MED ORDER — HYDROCHLOROTHIAZIDE 25 MG PO TABS: 25.0000 mg | ORAL_TABLET | Freq: Every day | ORAL | 3 refills | Status: AC

## 2024-10-06 NOTE — Telephone Encounter (Signed)
 Copied from CRM #[id number]. Topic: Clinical - Medication Refill >> Oct 06, 2024 11:54 AM Eva FALCON wrote: Medication: hydrochlorothiazide  (HYDRODIURIL ) 25 MG tablet  Has the patient contacted their pharmacy? No, bottle no more refills.  (Agent: If no, request that the patient contact the pharmacy for the refill. If patient does not wish to contact the pharmacy document the reason why and proceed with request.) (Agent: If yes, when and what did the pharmacy advise?)  This is the patient's preferred pharmacy:  [facility] [phone] [facility] [place] - [address] [place] [address] Phone: [phone] Fax: [phone]  Is this the correct pharmacy for this prescription? Yes If no, delete pharmacy and type the correct one.   Has the prescription been filled recently? No  Is the patient out of the medication? Yes  Has the patient been seen for an appointment in the last year OR does the patient have an upcoming appointment? Yes  Can we respond through MyChart? Yes  Agent: Please be advised that Rx refills may take up to 3 business days. We ask that you follow-up with your pharmacy.

## 2024-10-07 ENCOUNTER — Encounter (INDEPENDENT_AMBULATORY_CARE_PROVIDER_SITE_OTHER): Payer: Self-pay

## 2024-10-23 ENCOUNTER — Ambulatory Visit

## 2024-10-23 VITALS — BP 122/80 | HR 74 | Temp 97.5°F | Ht 72.0 in | Wt 277.0 lb

## 2024-10-23 DIAGNOSIS — I872 Venous insufficiency (chronic) (peripheral): Secondary | ICD-10-CM | POA: Diagnosis not present

## 2024-10-23 DIAGNOSIS — L819 Disorder of pigmentation, unspecified: Secondary | ICD-10-CM | POA: Diagnosis not present

## 2024-10-23 DIAGNOSIS — G44209 Tension-type headache, unspecified, not intractable: Secondary | ICD-10-CM

## 2024-10-23 NOTE — Patient Instructions (Addendum)
 Thank you for visiting [facility] today! Here's what we talked about: - Call dermatology if you do not hear from them in 2 weeks - Get us  your health records

## 2024-10-23 NOTE — Progress Notes (Signed)
 "  Subjective:   This visit was conducted in person. The patient gave informed consent to the use of Abridge AI technology to record the contents of the encounter as documented below.   Patient ID: Bailey Andrews, female    DOB: [birth year], 49 y.o.   MRN: [id number]   Discussed the use of AI scribe software for clinical note transcription with the patient, who gave verbal consent to proceed.  History of Present Illness Bailey Andrews is a 49 year old female who presents with leg swelling and discoloration.  She experiences leg swelling and discoloration, which she manages by wearing compression stockings daily and elevating her legs at night. The patient reports that wearing compression stockings daily and elevating her legs at night has made her legs feel less heavy, but she still notices some swelling and persistent discoloration on one side. She has undergone sclerotherapy and venous ablation in the past, but these treatments did not significantly help and resulted in more varicose veins.  She describes a sensation of head pain that feels like wearing a headband, which occurred last Friday after a nap. The pain was intense but short-lived. She also experiences tingling in her head, particularly after stressful days at work. She is taking Mucinex  for sinus issues and has not required medication for the head pain as it is not severe enough to disrupt her activities.  She is uncertain about the date of her last physical exam, recalling that her previous doctor retired and provided her medical records on a disc. She used to visit her every three months, making it difficult to pinpoint when her last physical was conducted. 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   Review of Systems  All other systems reviewed and are negative.       Allergies[1]  Medications Ordered Prior to Encounter[2]  BP 122/80 (BP Location: Left Arm, Patient Position: Sitting, Cuff Size: Large)   Pulse 74    Temp (!) 97.5 F (36.4 C) (Oral)   Ht 6' (1.829 m)   Wt 277 lb (125.6 kg)   LMP 09/22/2024   SpO2 99%   BMI 37.57 kg/m   Objective:      Physical Exam GENERAL: Alert, cooperative, well developed, no acute distress. HEAD: Normocephalic atraumatic. EYES: Extraocular movements intact BL, pupils round, equal and reactive to light BL, conjunctivae normal BL. EXTREMITIES: No cyanosis .  Varicose veins present with skin discoloration, see media, no edema NEUROLOGICAL: Oriented to person, place and time, no gait abnormalities, moves all extremities without gross motor or sensory deficit.         Assessment & Plan:   Assessment & Plan Chronic peripheral venous insufficiency Persistent leg swelling and discoloration. Compression stockings and leg elevation improved swelling. Previous sclerotherapy and venous ablation ineffective. Prefers to avoid further invasive procedures.  On my exam, swelling is definitely improved but skin discoloration remains unchanged, will seek specialist opinion about that.  - Continue compression stockings daily. - Continue leg elevation at night. - Referred to dermatology for leg discoloration evaluation.  Tension-type headache Chronic, intermittent head pain with headband sensation and tingling, likely stress-related. Symptoms not severe enough for medication. - Recommended scalp massages with peppermint oil or handheld scalp massager. - Advised over-the-counter anti-inflammatories if pain becomes significant.    Return in about 5 months (around 04/07/2025) for CPE, no fasting labs.   Remer Couse K Ramzy Cappelletti, MD  10/23/24     Contains text generated by Abridge.       [  1]  Allergies Allergen Reactions   Penicillins     hives  [2]  Current Outpatient Medications on File Prior to Visit  Medication Sig Dispense Refill   fluocinonide (LIDEX) 0.05 % external solution Apply 1 Application topically as needed.     hydrochlorothiazide  (HYDRODIURIL ) 25 MG  tablet Take 1 tablet (25 mg total) by mouth daily. NEEDS OFFICE VISIT 90 tablet 3   levocetirizine (XYZAL) 5 MG tablet Take 1 tablet by mouth as needed.     levothyroxine  (SYNTHROID ) 125 MCG tablet Take 1 tablet by mouth daily.     omeprazole (PRILOSEC OTC) 20 MG tablet Take 20 mg by mouth daily.     No current facility-administered medications on file prior to visit.   "

## 2024-11-05 ENCOUNTER — Ambulatory Visit

## 2024-11-17 ENCOUNTER — Ambulatory Visit (INDEPENDENT_AMBULATORY_CARE_PROVIDER_SITE_OTHER): Admitting: Vascular Surgery

## 2024-12-02 ENCOUNTER — Ambulatory Visit (INDEPENDENT_AMBULATORY_CARE_PROVIDER_SITE_OTHER): Admitting: Nurse Practitioner

## 2025-04-07 ENCOUNTER — Encounter
